# Patient Record
Sex: Female | Born: 1970 | Race: White | Hispanic: Yes | Marital: Married | Smoking: Never smoker
Health system: Southern US, Community
[De-identification: ages and names within clinical notes are randomized; demographics above are authoritative.]

## PROBLEM LIST (undated history)

## (undated) DIAGNOSIS — G43909 Migraine, unspecified, not intractable, without status migrainosus: Secondary | ICD-10-CM

## (undated) DIAGNOSIS — B351 Tinea unguium: Secondary | ICD-10-CM

## (undated) DIAGNOSIS — H547 Unspecified visual loss: Secondary | ICD-10-CM

## (undated) DIAGNOSIS — T7840XA Allergy, unspecified, initial encounter: Secondary | ICD-10-CM

## (undated) HISTORY — DX: Allergy, unspecified, initial encounter: T78.40XA

## (undated) HISTORY — DX: Tinea unguium: B35.1

## (undated) HISTORY — DX: Migraine, unspecified, not intractable, without status migrainosus: G43.909

## (undated) HISTORY — DX: Unspecified visual loss: H54.7

---

## 2004-07-26 ENCOUNTER — Ambulatory Visit: Payer: Self-pay | Admitting: Family Medicine

## 2004-08-02 ENCOUNTER — Ambulatory Visit (HOSPITAL_COMMUNITY): Admission: RE | Admit: 2004-08-02 | Discharge: 2004-08-02 | Payer: Self-pay | Admitting: Family Medicine

## 2004-08-02 ENCOUNTER — Ambulatory Visit: Payer: Self-pay | Admitting: Family Medicine

## 2004-09-03 ENCOUNTER — Ambulatory Visit: Payer: Self-pay | Admitting: Family Medicine

## 2004-09-13 ENCOUNTER — Ambulatory Visit (HOSPITAL_COMMUNITY): Admission: RE | Admit: 2004-09-13 | Discharge: 2004-09-13 | Payer: Self-pay | Admitting: *Deleted

## 2004-10-12 ENCOUNTER — Ambulatory Visit: Payer: Self-pay | Admitting: Sports Medicine

## 2004-10-31 HISTORY — PX: TUBAL LIGATION: SHX77

## 2004-11-11 ENCOUNTER — Ambulatory Visit: Payer: Self-pay | Admitting: Family Medicine

## 2004-11-12 ENCOUNTER — Ambulatory Visit: Payer: Self-pay | Admitting: Sports Medicine

## 2004-12-15 ENCOUNTER — Ambulatory Visit: Payer: Self-pay | Admitting: Family Medicine

## 2004-12-23 ENCOUNTER — Ambulatory Visit: Payer: Self-pay | Admitting: Family Medicine

## 2004-12-25 ENCOUNTER — Inpatient Hospital Stay (HOSPITAL_COMMUNITY): Admission: AD | Admit: 2004-12-25 | Discharge: 2004-12-25 | Payer: Self-pay | Admitting: *Deleted

## 2005-01-06 ENCOUNTER — Ambulatory Visit: Payer: Self-pay | Admitting: Sports Medicine

## 2005-01-10 ENCOUNTER — Inpatient Hospital Stay (HOSPITAL_COMMUNITY): Admission: AD | Admit: 2005-01-10 | Discharge: 2005-01-13 | Payer: Self-pay | Admitting: *Deleted

## 2005-01-16 ENCOUNTER — Ambulatory Visit: Payer: Self-pay | Admitting: Family Medicine

## 2005-01-16 ENCOUNTER — Inpatient Hospital Stay (HOSPITAL_COMMUNITY): Admission: AD | Admit: 2005-01-16 | Discharge: 2005-01-16 | Payer: Self-pay | Admitting: Family Medicine

## 2005-02-21 ENCOUNTER — Ambulatory Visit: Payer: Self-pay | Admitting: *Deleted

## 2005-02-25 ENCOUNTER — Emergency Department (HOSPITAL_COMMUNITY): Admission: EM | Admit: 2005-02-25 | Discharge: 2005-02-25 | Payer: Self-pay | Admitting: Emergency Medicine

## 2005-05-16 ENCOUNTER — Emergency Department (HOSPITAL_COMMUNITY): Admission: EM | Admit: 2005-05-16 | Discharge: 2005-05-16 | Payer: Self-pay | Admitting: Family Medicine

## 2010-12-28 ENCOUNTER — Other Ambulatory Visit: Payer: Self-pay | Admitting: Emergency Medicine

## 2010-12-28 ENCOUNTER — Observation Stay (HOSPITAL_COMMUNITY)
Admission: EM | Admit: 2010-12-28 | Discharge: 2010-12-30 | Disposition: A | Discharge status: Home / Self Care | Payer: Self-pay | Attending: Internal Medicine | Admitting: Internal Medicine

## 2010-12-28 ENCOUNTER — Emergency Department (HOSPITAL_COMMUNITY): Payer: Self-pay

## 2010-12-28 DIAGNOSIS — I10 Essential (primary) hypertension: Secondary | ICD-10-CM | POA: Insufficient documentation

## 2010-12-28 DIAGNOSIS — D509 Iron deficiency anemia, unspecified: Principal | ICD-10-CM | POA: Insufficient documentation

## 2010-12-28 DIAGNOSIS — R51 Headache: Secondary | ICD-10-CM | POA: Insufficient documentation

## 2010-12-28 DIAGNOSIS — Z23 Encounter for immunization: Secondary | ICD-10-CM | POA: Insufficient documentation

## 2010-12-28 DIAGNOSIS — R55 Syncope and collapse: Secondary | ICD-10-CM | POA: Insufficient documentation

## 2010-12-28 DIAGNOSIS — E860 Dehydration: Secondary | ICD-10-CM | POA: Insufficient documentation

## 2010-12-28 DIAGNOSIS — R0602 Shortness of breath: Secondary | ICD-10-CM | POA: Insufficient documentation

## 2010-12-28 LAB — POCT I-STAT, CHEM 8
BUN: 15 mg/dL (ref 6–23)
Calcium, Ion: 1.1 mmol/L — ABNORMAL LOW (ref 1.12–1.32)
Chloride: 108 mEq/L (ref 96–112)
Creatinine, Ser: 0.7 mg/dL (ref 0.4–1.2)
Glucose, Bld: 98 mg/dL (ref 70–99)
HCT: 28 % — ABNORMAL LOW (ref 36.0–46.0)
Hemoglobin: 9.5 g/dL — ABNORMAL LOW (ref 12.0–15.0)
Potassium: 4.4 mEq/L (ref 3.5–5.1)
Sodium: 140 mEq/L (ref 135–145)
TCO2: 21 mmol/L (ref 0–100)

## 2010-12-28 LAB — OCCULT BLOOD, POC DEVICE: Fecal Occult Bld: NEGATIVE

## 2010-12-28 LAB — POCT PREGNANCY, URINE: Preg Test, Ur: NEGATIVE

## 2010-12-28 LAB — GLUCOSE, CAPILLARY: Glucose-Capillary: 135 mg/dL — ABNORMAL HIGH (ref 70–99)

## 2010-12-29 DIAGNOSIS — R55 Syncope and collapse: Secondary | ICD-10-CM

## 2010-12-29 LAB — LIPID PANEL
Cholesterol: 156 mg/dL (ref 0–200)
HDL: 43 mg/dL (ref >39)
LDL Cholesterol: 98 mg/dL (ref 0–99)
Total CHOL/HDL Ratio: 3.6 RATIO
Triglycerides: 75 mg/dL (ref <150)
VLDL: 15 mg/dL (ref 0–40)

## 2010-12-29 LAB — MAGNESIUM: Magnesium: 2.1 mg/dL (ref 1.5–2.5)

## 2010-12-29 LAB — CBC
HCT: 27.4 % — ABNORMAL LOW (ref 36.0–46.0)
Hemoglobin: 8.1 g/dL — ABNORMAL LOW (ref 12.0–15.0)
MCH: 20 pg — ABNORMAL LOW (ref 26.0–34.0)
MCHC: 29.6 g/dL — ABNORMAL LOW (ref 30.0–36.0)
MCV: 67.7 fL — ABNORMAL LOW (ref 78.0–100.0)
Platelets: 430 10*3/uL — ABNORMAL HIGH (ref 150–400)
RBC: 4.05 MIL/uL (ref 3.87–5.11)
RDW: 18.1 % — ABNORMAL HIGH (ref 11.5–15.5)
WBC: 7 10*3/uL (ref 4.0–10.5)

## 2010-12-29 LAB — COMPREHENSIVE METABOLIC PANEL
ALT: 31 U/L (ref 0–35)
AST: 25 U/L (ref 0–37)
Albumin: 3.2 g/dL — ABNORMAL LOW (ref 3.5–5.2)
Alkaline Phosphatase: 55 U/L (ref 39–117)
BUN: 6 mg/dL (ref 6–23)
CO2: 22 mEq/L (ref 19–32)
Calcium: 8.3 mg/dL — ABNORMAL LOW (ref 8.4–10.5)
Chloride: 110 mEq/L (ref 96–112)
Creatinine, Ser: 0.46 mg/dL (ref 0.4–1.2)
GFR calc Af Amer: >60 mL/min (ref >60)
GFR calc non Af Amer: >60 mL/min (ref >60)
Glucose, Bld: 90 mg/dL (ref 70–99)
Potassium: 4 mEq/L (ref 3.5–5.1)
Sodium: 138 mEq/L (ref 135–145)
Total Bilirubin: 0.5 mg/dL (ref 0.3–1.2)
Total Protein: 6 g/dL (ref 6.0–8.3)

## 2010-12-29 LAB — PHOSPHORUS: Phosphorus: 4 mg/dL (ref 2.3–4.6)

## 2010-12-29 LAB — IRON AND TIBC
Iron: 13 ug/dL — ABNORMAL LOW (ref 42–135)
Saturation Ratios: 3 % — ABNORMAL LOW (ref 20–55)
TIBC: 384 ug/dL (ref 250–470)
UIBC: 371 ug/dL

## 2010-12-29 LAB — TSH: TSH: 1.216 u[IU]/mL (ref 0.350–4.500)

## 2010-12-29 LAB — CARDIAC PANEL(CRET KIN+CKTOT+MB+TROPI)
CK, MB: 0.7 ng/mL (ref 0.3–4.0)
CK, MB: 0.6 ng/mL (ref 0.3–4.0)
Relative Index: INVALID (ref 0.0–2.5)
Relative Index: INVALID (ref 0.0–2.5)
Total CK: 75 U/L (ref 7–177)
Total CK: 68 U/L (ref 7–177)
Troponin I: <0.01 ng/mL (ref 0.00–0.06)

## 2010-12-29 LAB — CK TOTAL AND CKMB (NOT AT ARMC)
CK, MB: 0.8 ng/mL (ref 0.3–4.0)
Relative Index: INVALID (ref 0.0–2.5)
Total CK: 85 U/L (ref 7–177)

## 2010-12-29 LAB — FERRITIN: Ferritin: 1 ng/mL — ABNORMAL LOW (ref 10–291)

## 2010-12-29 LAB — TROPONIN I: Troponin I: <0.01 ng/mL (ref 0.00–0.06)

## 2010-12-29 LAB — VITAMIN B12: Vitamin B-12: 241 pg/mL (ref 211–911)

## 2010-12-29 LAB — FOLATE: Folate: 17.6 ng/mL

## 2010-12-29 LAB — BRAIN NATRIURETIC PEPTIDE: Pro B Natriuretic peptide (BNP): <30 pg/mL (ref 0.0–100.0)

## 2010-12-30 LAB — BASIC METABOLIC PANEL
CO2: 23 mEq/L (ref 19–32)
Calcium: 8.6 mg/dL (ref 8.4–10.5)
Creatinine, Ser: 0.54 mg/dL (ref 0.4–1.2)
GFR calc Af Amer: >60 mL/min (ref >60)
GFR calc non Af Amer: >60 mL/min (ref >60)
Sodium: 141 mEq/L (ref 135–145)

## 2010-12-30 LAB — CBC
Hemoglobin: 8.1 g/dL — ABNORMAL LOW (ref 12.0–15.0)
MCH: 20.5 pg — ABNORMAL LOW (ref 26.0–34.0)
MCHC: 30.2 g/dL (ref 30.0–36.0)
Platelets: 382 10*3/uL (ref 150–400)
RBC: 3.95 MIL/uL (ref 3.87–5.11)

## 2011-01-06 NOTE — H&P (Signed)
Linda Bridges, Linda Bridges             ACCOUNT NO.:  [id number]  MEDICAL RECORD NO.:  [id number]           PATIENT TYPE:  I  LOCATION:  4743                         FACILITY:  [facility]  PHYSICIAN:  Lonia Blood, M.D.      DATE OF BIRTH:  December 19, 1970  DATE OF ADMISSION:  12/28/2010 DATE OF DISCHARGE:                             HISTORY & PHYSICAL   PRIMARY CARE PHYSICIAN:  She is unassigned to [place].  PRESENTING COMPLAINT:  Passing out.  HISTORY OF PRESENT ILLNESS:  The patient is a 40 year old [place] female who speaks little or no English that was probably  playing soccer outside with her daughter today when she suddenly developed weakness and almost passed out.  She became very dizzy.  She describes some chest pain.  She was short of breath and felt like she was going to pass out. Since then, she has some headache and blurred vision.  EMS was called. On arrival, her blood pressure was 60/40.  They gave her IV fluid and brought her to the emergency room.  She initially responded to IV fluids.  Her blood pressure started improving, but then again dropped down to 88/63.  The patient is currently responding to IV fluids.  She has no prior history of passing out.  No prior history of heart disease. No other significant past history.  The patient is currently stable. Her only complaint at this point is her headaches.  PAST MEDICAL HISTORY:  She has no past medical history.  ALLERGIES:  No known drug allergies.  MEDICATIONS:  None.  SOCIAL HISTORY:  She is married, with 3 children.  She denied tobacco, alcohol, or IV drug use.  She is for the most part active around the household.  FAMILY HISTORY:  Her mother apparently had heart disease, but she did not know at what age it started.  REVIEW OF SYSTEMS:  The patient's menstrual history shows some erratic bleed.  She had some periods that last 8 days, some that last 4 days. Of recent, she noticed that she has used more pads than usual.  Other than that, all systems reviewed are negative except per HPI.  PHYSICAL EXAMINATION:  VITAL SIGNS:  Temperature 97.1.  Her current blood pressure is 95/63 with a pulse of 78, respiratory rate 17.  Her sats are 99% on room air. GENERAL:  She is a very pleasant [place] woman, non-English-speaking, but communicating through her son and husband.  She has no acute distress. HEENT: PERRL.  EOMI.  No pallor, no jaundice.  No rhinorrhea. NECK:  Supple.  No JVD, no lymphadenopathy. RESPIRATORY:  She has good air entry bilaterally.  No wheezes, no rales, no crackles.  CARDIOVASCULAR SYSTEM:  She has S1, S2.  No audible murmur. ABDOMEN:  Soft, full, nontender with positive bowel sounds. EXTREMITIES:  No edema, cyanosis, or clubbing.  LABORATORY DATA:  Hemoglobin is 9.5 with hematocrit of 28.  Sodium 140, potassium 4.4, chloride 108, glucose 98, BUN 15, creatinine 0.7. Pregnancy test is negative.  Fecal occult blood is also negative.  BNP is less than 30.  Initial cardiac enzymes are negative.  Magnesium 2.1, phosphorus 4.0.  Her EKG showed normal sinus rhythm with nonspecific ST- T wave changes.  Head CT without contrast showed no acute intracranial findings.  Chest x-ray showed no acute cardiopulmonary findings.  ASSESSMENT:  This a 40 year old female presenting with presyncopal episode and profound hypotension.  This happened 5 minutes into exercise.  She had no known prior cardiac history, however, the patient has not been following with patience, so we do not know her cholesterol status.  She had reported family history of heart disease, but she is not sure at what age it started in her mother.  PLAN: 1. Presyncope and hypotension.  We will admit the patient, hydrate her     aggressively, get a 2-D echo.  We do need to rule out any intrinsic     cardiac disease including hypertrophic cardiomyopathy.  One status     ruled out, it may turn out to be just vasovagal syncopal episode      the fact that she has hypotension.  Once we had resuscitated, I     will continue orthostasis daily to make sure she is fully hydrated.     The patient has some anemia, probably from heavy menstrual, but no     evidence of really profound dehydration based on her BUN and     creatinine.  Also, the patient's flu vac was negative.  I therefore     do not believe this is an acute bleeding episodes. 2. Anemia.  Again, her anemia seems to be probably chronic from heavy     menstrual bleed on and off.  I will check anemia panel and     depending on the findings, we will consider putting her on iron.     If her hemoglobin continues to drop, then we will consider possible     transfusion if it drops below 7. 3. Headache.  The cause of her headache is not clear.  It will be     related to her presyncope.  I doubt this is a stroke or any     primarily a neurological case.     Lonia Blood, M.D.     Verlin Grills  D:  12/29/2010  T:  12/29/2010  Job:  [id number]  Electronically Signed by Lonia Blood M.D. on 01/06/2011 06:32:57 AM

## 2011-01-18 ENCOUNTER — Emergency Department (HOSPITAL_COMMUNITY)
Admission: EM | Admit: 2011-01-18 | Discharge: 2011-01-18 | Disposition: A | Discharge status: Home / Self Care | Payer: No Typology Code available for payment source | Attending: Emergency Medicine | Admitting: Emergency Medicine

## 2011-01-18 ENCOUNTER — Emergency Department (HOSPITAL_COMMUNITY): Payer: Self-pay

## 2011-01-18 DIAGNOSIS — S8000XA Contusion of unspecified knee, initial encounter: Secondary | ICD-10-CM | POA: Insufficient documentation

## 2011-01-18 DIAGNOSIS — S20219A Contusion of unspecified front wall of thorax, initial encounter: Secondary | ICD-10-CM | POA: Insufficient documentation

## 2011-01-18 DIAGNOSIS — M25569 Pain in unspecified knee: Secondary | ICD-10-CM | POA: Insufficient documentation

## 2011-01-18 DIAGNOSIS — R071 Chest pain on breathing: Secondary | ICD-10-CM | POA: Insufficient documentation

## 2011-02-08 LAB — URINALYSIS, ROUTINE W REFLEX MICROSCOPIC
Bilirubin Urine: NEGATIVE
Nitrite: NEGATIVE
Specific Gravity, Urine: 1.018 (ref 1.005–1.030)
Urobilinogen, UA: 1 mg/dL (ref 0.0–1.0)
pH: 7 (ref 5.0–8.0)

## 2011-02-08 LAB — CBC
Hemoglobin: 8.1 g/dL — ABNORMAL LOW (ref 12.0–15.0)
MCH: 20.5 pg — ABNORMAL LOW (ref 26.0–34.0)
Platelets: 410 10*3/uL — ABNORMAL HIGH (ref 150–400)
RBC: 3.96 MIL/uL (ref 3.87–5.11)
WBC: 11.9 10*3/uL — ABNORMAL HIGH (ref 4.0–10.5)

## 2011-02-08 LAB — DIFFERENTIAL
Basophils Absolute: 0 10*3/uL (ref 0.0–0.1)
Eosinophils Relative: 0 % (ref 0–5)
Lymphocytes Relative: 14 % (ref 12–46)
Lymphs Abs: 1.7 10*3/uL (ref 0.7–4.0)
Monocytes Relative: 5 % (ref 3–12)
Neutrophils Relative %: 81 % — ABNORMAL HIGH (ref 43–77)

## 2011-02-08 LAB — URINE MICROSCOPIC-ADD ON

## 2011-02-08 LAB — D-DIMER, QUANTITATIVE: D-Dimer, Quant: 0.35 ug/mL-FEU (ref 0.00–0.48)

## 2012-08-13 IMAGING — CT CT HEAD W/O CM
1 of 2 series · 13 of 30 positions shown, 17 images · non-contrast
Comparison: None

CLINICAL DATA: Shortness of breath, headache and dizziness.

CT HEAD WITHOUT CONTRAST
TECHNIQUE: Contiguous axial images were obtained from the base of
the skull through the vertex without contrast.

[Series 2: brain · axial · 0.47mm/px · z∈[+135,+259]mm · 13 of 28 slices shown, 17 images]
[im 2/28  brain]
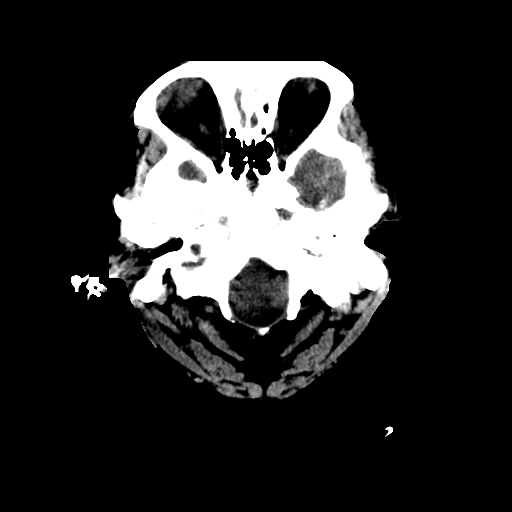
[im 2/28  bone]
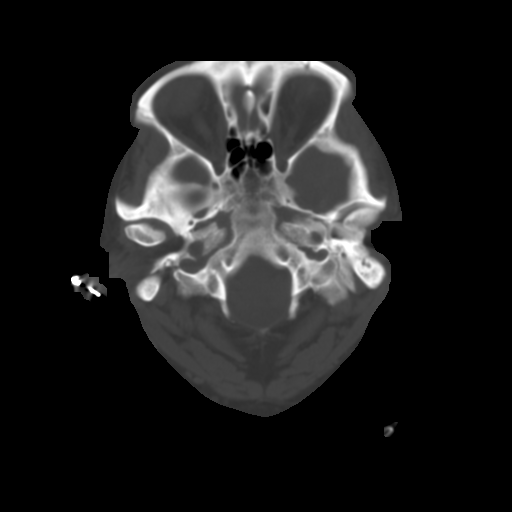
[im 4/28  brain]
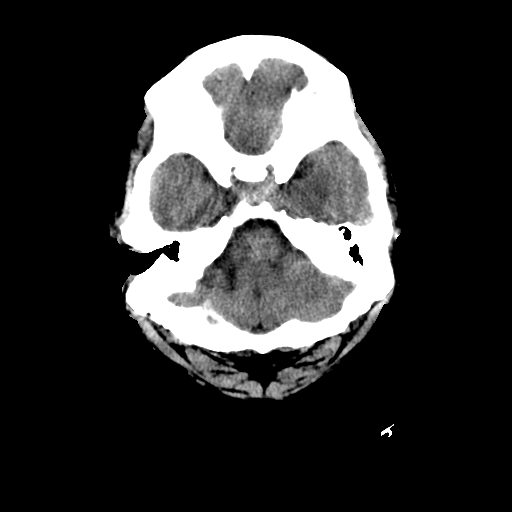
[im 6/28  brain]
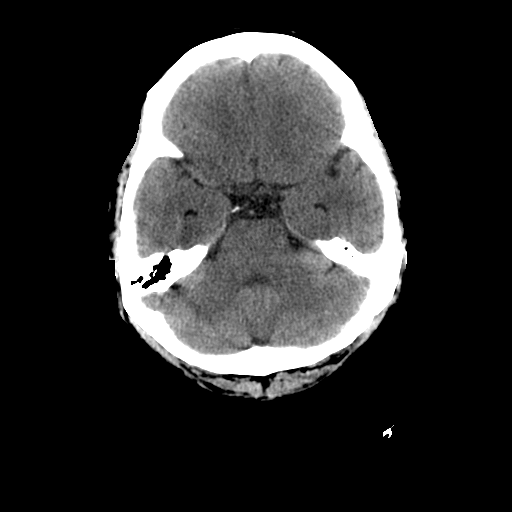
[im 8/28  brain]
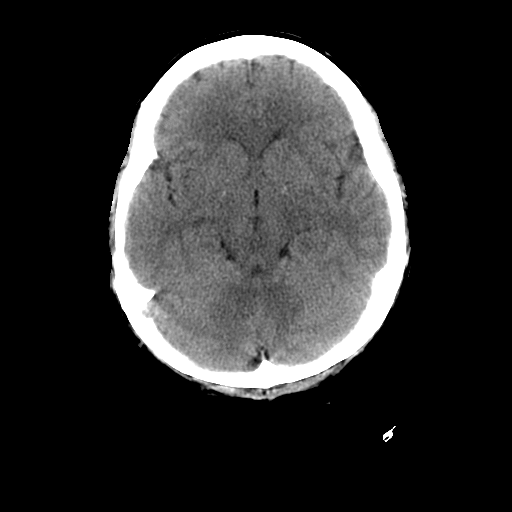
[im 10/28  brain]
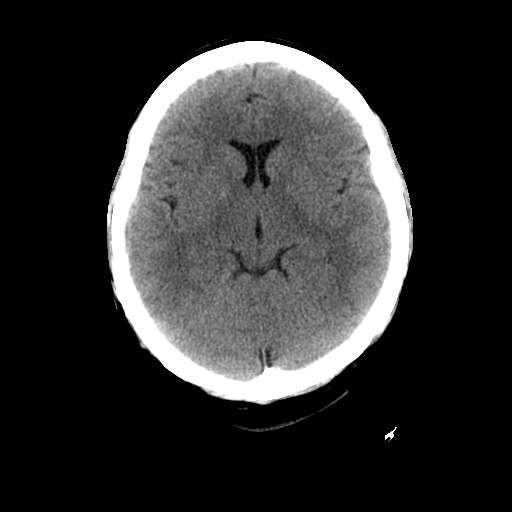
[im 10/28  bone]
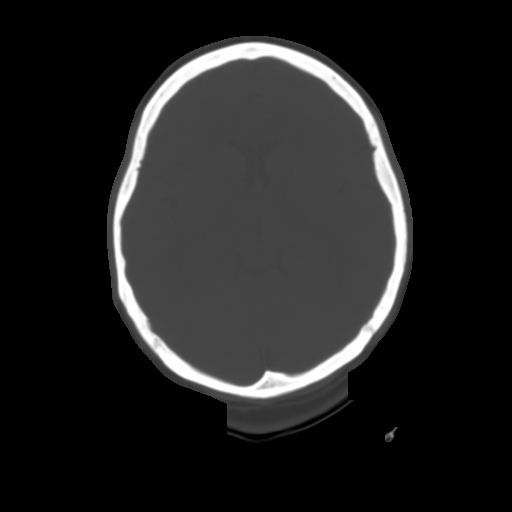
[im 12/28  brain]
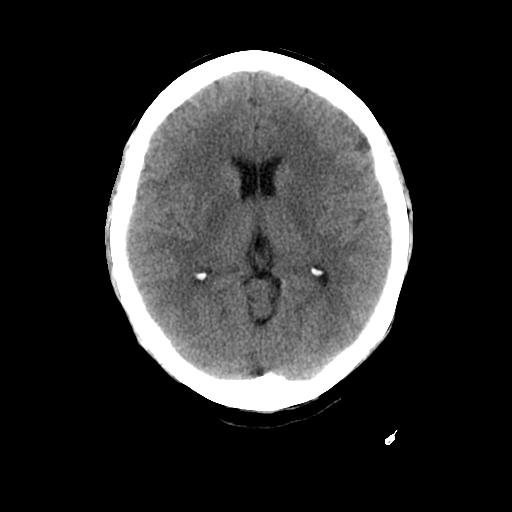
[im 14/28  brain]
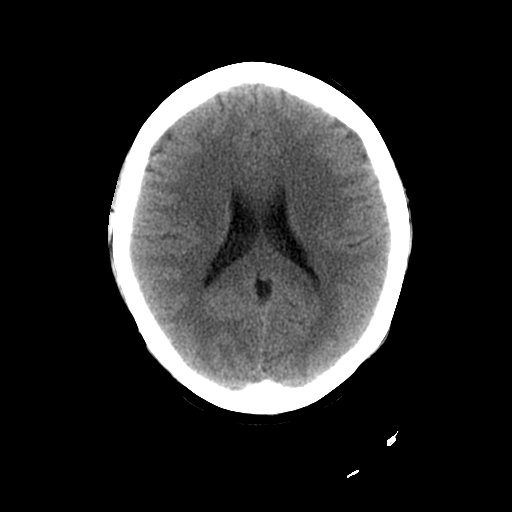
[im 16/28  brain]
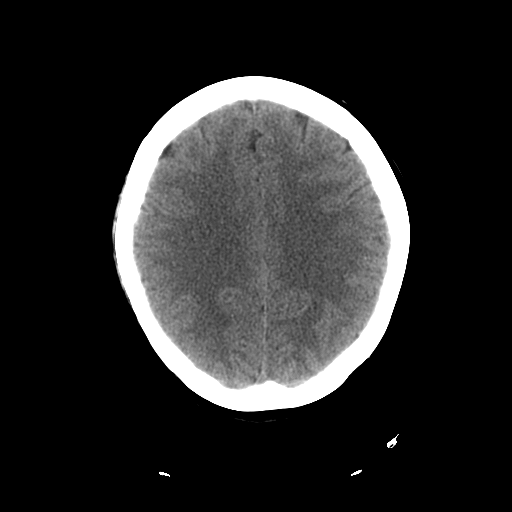
[im 18/28  brain]
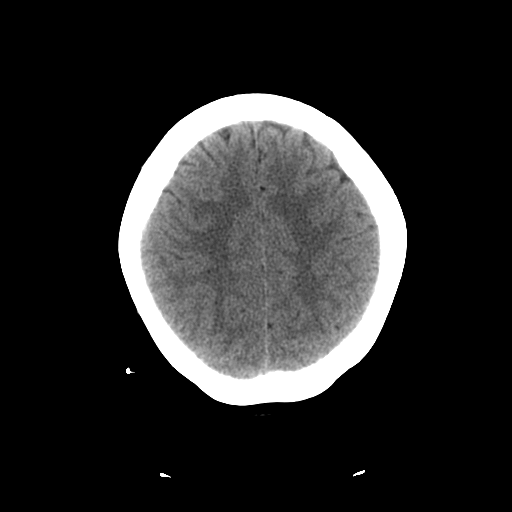
[im 18/28  bone]
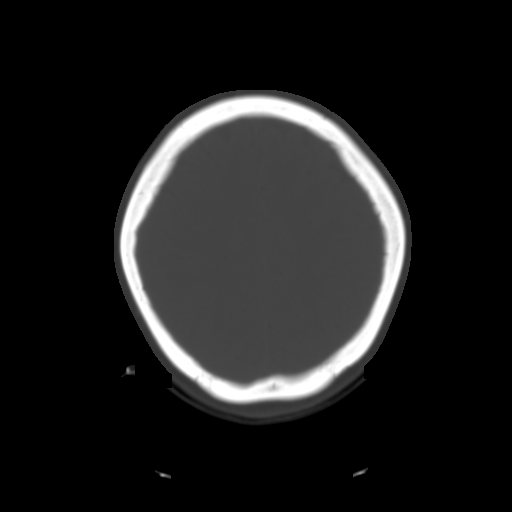
[im 20/28  brain]
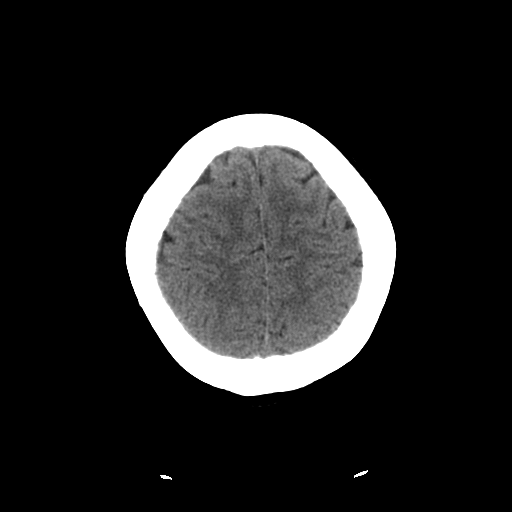
[im 22/28  brain]
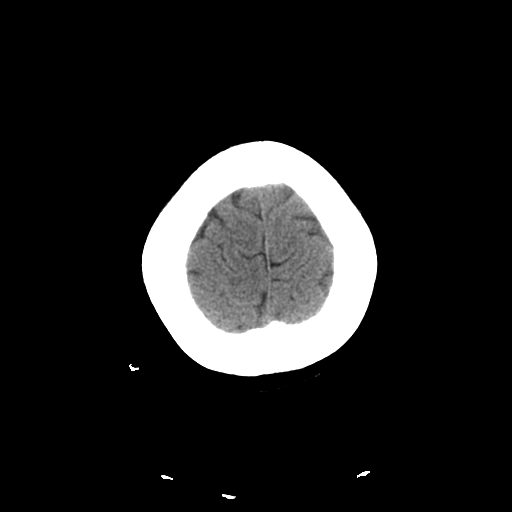
[im 24/28  brain]
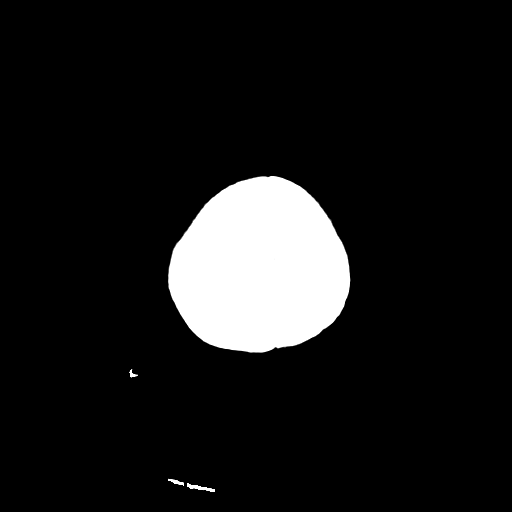
[im 26/28  brain]
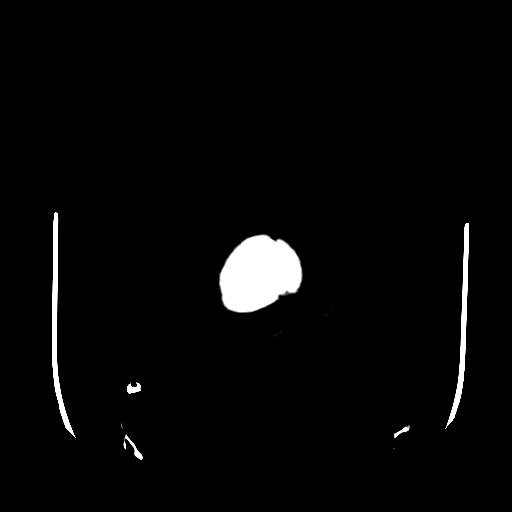
[im 26/28  bone]
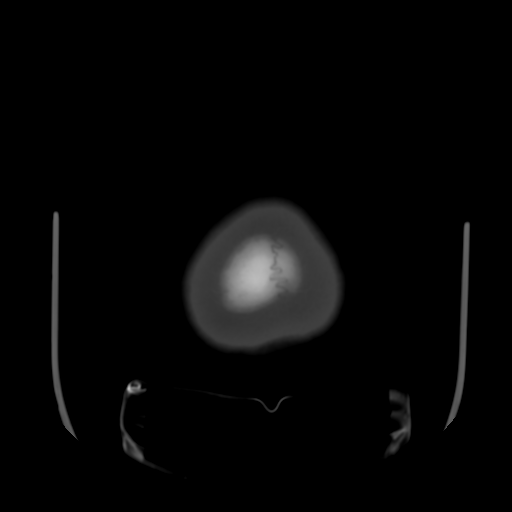

[13 of 30 positions shown; findings below may reference images not displayed]

FINDINGS: The ventricles are normal.  No extra-axial fluid
collections are seen.  The brainstem and cerebellum are
unremarkable.  No acute intracranial findings such as infarction or
hemorrhage.  No mass lesions.

The bony calvarium is intact.  The visualized paranasal sinuses and
mastoid air cells are clear.
IMPRESSION: No acute intracranial findings or mass lesions.

## 2015-07-02 DIAGNOSIS — B351 Tinea unguium: Secondary | ICD-10-CM

## 2015-07-02 HISTORY — DX: Tinea unguium: B35.1

## 2015-08-13 ENCOUNTER — Ambulatory Visit (INDEPENDENT_AMBULATORY_CARE_PROVIDER_SITE_OTHER): Payer: Self-pay | Admitting: Internal Medicine

## 2015-08-13 ENCOUNTER — Encounter: Payer: Self-pay | Admitting: Internal Medicine

## 2015-08-13 VITALS — BP 114/80 | HR 72 | Ht <= 58 in | Wt 151.0 lb

## 2015-08-13 DIAGNOSIS — G43909 Migraine, unspecified, not intractable, without status migrainosus: Secondary | ICD-10-CM | POA: Insufficient documentation

## 2015-08-13 DIAGNOSIS — Z79899 Other long term (current) drug therapy: Secondary | ICD-10-CM

## 2015-08-13 DIAGNOSIS — L819 Disorder of pigmentation, unspecified: Secondary | ICD-10-CM

## 2015-08-13 DIAGNOSIS — T7840XA Allergy, unspecified, initial encounter: Secondary | ICD-10-CM | POA: Insufficient documentation

## 2015-08-13 DIAGNOSIS — B351 Tinea unguium: Secondary | ICD-10-CM

## 2015-08-13 NOTE — Patient Instructions (Addendum)
Escisin de lesiones en la piel, cuidados posteriores (Excision of Skin Lesions, Care After) Siga estas instrucciones durante las prximas semanas. Estas indicaciones le proporcionan informacin acerca de cmo deber cuidarse despus del procedimiento. El mdico tambin podr darle instrucciones ms especficas. El tratamiento ha sido planificado segn las prcticas mdicas actuales, pero en algunos casos pueden ocurrir problemas. Comunquese con el mdico si tiene algn problema o dudas despus del procedimiento. QU ESPERAR DESPUS DEL PROCEDIMIENTO Despus del procedimiento, es comn tener dolor o Social workermolestias en el lugar de la escisin. INSTRUCCIONES PARA EL CUIDADO EN EL HOGAR  Tome los medicamentos de venta libre y los recetados solamente como se lo haya indicado el mdico.  Siga las indicaciones del mdico acerca de lo siguiente:  Cmo cuidar Immunologistel lugar de la escisin. Debe mantener Immunologistel lugar limpio, seco y protegido durante al menos 48horas.  Cmo y cundo cambiar las vendas (vendaje).  Cundo retirar el vendaje.  Quitar lo que sea que se haya usado para Psychologist, educationalcerrar el lugar de la escisin.  Controle todos los das la zona de la escisin para detectar signos de infeccin. Est atento a lo siguiente:  Dolor, hinchazn o enrojecimiento.  Lquido, sangre o pus.  En caso de hemorragia, presione la herida durante 20minutos de forma suave pero firme con una toalla doblada.  Evite las actividades y los ejercicios de alto impacto hasta que le quiten los puntos (suturas) o hasta que la zona cicatrice.  Siga las indicaciones del mdico acerca de cmo minimizar la formacin de cicatrices. Evite la exposicin al sol hasta que el rea se haya cicatrizado. Las cicatrices deben reducirse con el transcurso del [place] Mill[place]po.  Concurra a todas las visitas de control como se lo haya indicado el mdico. Esto es importante. SOLICITE ATENCIN MDICA SI:  Lance Mussiene fiebre.  Tiene enrojecimiento, hinchazn o Product/process development scientistdolor  en el lugar de la escisin.  Tiene lquido, sangre o pus que emanan del lugar de la escisin.  Tiene Brewing technologistsangrado permanente en el lugar de la escisin.  Tiene dolor, y este no mejora despus de 2 o 3das del procedimiento.  Observa irregularidades en la piel o cambios en la sensibilidad.   Esta informacin no tiene Theme park managercomo fin reemplazar el consejo del mdico. Asegrese de hacerle al mdico cualquier pregunta que tenga.   Document Released: 07/08/2015 Elsevier Interactive Patient Education [employer]2016 Elsevier Inc.

## 2015-08-13 NOTE — Progress Notes (Signed)
Patient ID: Linda DolphinHortencia Cabrera Bridges, female   DOB: [birth year], 44 y.o.   MRN: [id number]  Here for removal of pigmented lesion from left shoulder/suprclavicular area.  Pt. Requested removal at last visit.  Did take Ibuprofen 800 mg 2 nights ago.  Does not bleed easily.   Discussed risk of bleeding or infection with removal.   Risks interpreted for patient and she wishes to proceed.  Procedure Note:  Left supraclavicular area cleaned and draped in sterile fashion.  5 cc of 1 % Lidocaine with epi injected to subcutaneous tissue surrounding 5X7 mm nevus.  Elliptical total excisional biopsy of lesion performed with grossly free margins.  Good hemostasis obtained.  Elliptical wound closed with 3 simple interrupted 4-0 nylon sutures.   Pt. Tolerated procedure without complication.   Dressing applied.  A/P  1.  Pigmented nevus of uncertain nature.  Excisional biopsy completed.  [facility]. Pt given oral and written instructions with Spanish interpretation on wound care and triggers to call back before returning in 10 days for suture removal.    2.  Onychomycosis of toenails:  Pt. At about 6 weeks of Terbinafine.  Check hepatic function panel.

## 2015-08-14 LAB — HEPATIC FUNCTION PANEL
ALK PHOS: 78 IU/L (ref 39–117)
ALT: 12 IU/L (ref 0–32)
AST: 12 IU/L (ref 0–40)
Albumin: 4.2 g/dL (ref 3.5–5.5)
BILIRUBIN, DIRECT: 0.06 mg/dL (ref 0.00–0.40)
Bilirubin Total: 0.2 mg/dL (ref 0.0–1.2)
TOTAL PROTEIN: 7.5 g/dL (ref 6.0–8.5)

## 2015-08-18 ENCOUNTER — Encounter: Payer: Self-pay | Admitting: Internal Medicine

## 2015-08-18 ENCOUNTER — Ambulatory Visit (INDEPENDENT_AMBULATORY_CARE_PROVIDER_SITE_OTHER): Payer: Self-pay | Admitting: Internal Medicine

## 2015-08-18 VITALS — Temp 98.4°F

## 2015-08-18 DIAGNOSIS — T814XXA Infection following a procedure, initial encounter: Secondary | ICD-10-CM

## 2015-08-18 DIAGNOSIS — IMO0001 Reserved for inherently not codable concepts without codable children: Secondary | ICD-10-CM

## 2015-08-18 MED ORDER — CEPHALEXIN 500 MG PO CAPS: 500.0000 mg | ORAL_CAPSULE | Freq: Four times a day (QID) | ORAL | Status: DC

## 2015-08-18 NOTE — Progress Notes (Signed)
   Subjective:    Patient ID: Linda Bridges, female    DOB: 1971/01/13, 44 y.o.   MRN: [id number]  HPI  Patient here status post removal of skin lesion from left upper back/shoulder Developed some redness, pain and discharge from wound, left shoulder yesterday.  No fever.    Review of Systems     Objective:   Physical Exam  Mild inflammation of wound edges.  With pressure on the anterior aspect of wound, scant yellow pustular discharge comes through front 2 sutures.  Tender with pressure. Anterior suture removed with 2 mm opening of wound in that area resulting. Bandage reapplied      Assessment & Plan:  Wound infection:  Suture removed for better drainage.  Hopefully, can leave the other 2 sutures and start antibiotics. Cephalexin 500 mg 4 times daily for 7 days.

## 2015-08-18 NOTE — Patient Instructions (Signed)
Change dressing twice daily Keep wound dry.

## 2015-08-20 ENCOUNTER — Ambulatory Visit (INDEPENDENT_AMBULATORY_CARE_PROVIDER_SITE_OTHER): Payer: Self-pay | Admitting: Internal Medicine

## 2015-08-20 ENCOUNTER — Ambulatory Visit: Payer: Self-pay | Admitting: Internal Medicine

## 2015-08-20 ENCOUNTER — Encounter: Payer: Self-pay | Admitting: Internal Medicine

## 2015-08-20 VITALS — BP 122/80 | HR 72 | Ht <= 58 in | Wt 151.0 lb

## 2015-08-20 DIAGNOSIS — T814XXD Infection following a procedure, subsequent encounter: Secondary | ICD-10-CM

## 2015-08-20 DIAGNOSIS — IMO0001 Reserved for inherently not codable concepts without codable children: Secondary | ICD-10-CM

## 2015-08-20 NOTE — Progress Notes (Signed)
   Subjective:    Patient ID: Linda Bridges, female    DOB: 08/05/1971, 44 y.o.   MRN: [id number]  HPI  Wound infection.  Patient did not get Cephalexin filled until yesterday afternoon.  Has taken a total of 5 tablets thus far. Despite this, feels much better.  Mainly, the pain is better, but discharge is decreased as well.    Review of Systems     Objective:   Physical Exam  Erythema and swelling about wound improved.  Minimal pustular discharge from end of wound where suture removed. Minimally tender today.      Assessment & Plan:  Wound infection:  Healing.  Do not believe will need to remove remaining sutures early.

## 2015-08-24 ENCOUNTER — Ambulatory Visit (INDEPENDENT_AMBULATORY_CARE_PROVIDER_SITE_OTHER): Payer: Self-pay | Admitting: Internal Medicine

## 2015-08-24 ENCOUNTER — Encounter: Payer: Self-pay | Admitting: Internal Medicine

## 2015-08-24 ENCOUNTER — Ambulatory Visit: Payer: No Typology Code available for payment source | Admitting: Internal Medicine

## 2015-08-24 VITALS — BP 110/80 | HR 82 | Ht <= 58 in | Wt 150.0 lb

## 2015-08-24 DIAGNOSIS — Z4802 Encounter for removal of sutures: Secondary | ICD-10-CM

## 2015-08-27 ENCOUNTER — Telehealth: Payer: Self-pay | Admitting: Internal Medicine

## 2015-08-27 NOTE — Telephone Encounter (Signed)
08/27/15 11:14am patient called this morning asking for advice on what she needs to do with wound on her left shoulder. She is having some drainage looks like water mix with blood from a little "hole" that is still there. No pain, swelling, or redness on the area.

## 2015-08-27 NOTE — Progress Notes (Signed)
Quick Note:  08/27/15 11:06 called patient. Voicemail is not set up to leave message, will try again later. ______

## 2015-08-27 NOTE — Progress Notes (Signed)
Quick Note:  10/247/16 11:14 am patient returned call. Patient informed of results. ______

## 2015-08-28 NOTE — Telephone Encounter (Signed)
The wound should heal from the bottom up and the hole eventually close, the discharge should gradually decrease.   If the discharge is increasing, there is increased redness, pain, or swelling, she needs to come back and be seen.

## 2015-08-31 NOTE — Telephone Encounter (Signed)
08/31/15 11:30 am Patient informed. Patient states wound looks better now. She is washing and drying the area 3 times a day and not covering it with the band aid so it can vent. Patient will call if something is abnormal.

## 2015-11-03 NOTE — Progress Notes (Signed)
Pt. Here for remaining 2 sutures to be removed from left shoulder wound that developed secondary infection after excisional benign skin lesion removal on 10/132016.  Pt. Has about 1 more day of antibiotics--Cephalexin  Wound on left shoulder without redness, swelling or tenderness today.  Pinhole opening at one end where suture removed previously to allow pustular discharge to drain.  2/3 of the 1 cm wound has edges well opposed and appears to be healing nicely.  Sutures removed without dehiscence.  Pt. Tolerated well.  A/P:  Suture removal from small surgical wound with secondary infection, now healing well.

## 2015-11-03 NOTE — Patient Instructions (Signed)
Finish antibiotics Twice daily dressing changes

## 2015-11-20 ENCOUNTER — Ambulatory Visit (INDEPENDENT_AMBULATORY_CARE_PROVIDER_SITE_OTHER): Payer: Self-pay | Admitting: Internal Medicine

## 2015-11-20 VITALS — BP 118/80 | HR 76 | Ht <= 58 in | Wt 153.0 lb

## 2015-11-20 DIAGNOSIS — H547 Unspecified visual loss: Secondary | ICD-10-CM

## 2015-11-20 DIAGNOSIS — B351 Tinea unguium: Secondary | ICD-10-CM

## 2015-11-20 DIAGNOSIS — K0889 Other specified disorders of teeth and supporting structures: Secondary | ICD-10-CM

## 2015-11-20 DIAGNOSIS — L600 Ingrowing nail: Secondary | ICD-10-CM

## 2015-11-20 MED ORDER — CEPHALEXIN 500 MG PO CAPS: 500.0000 mg | ORAL_CAPSULE | Freq: Two times a day (BID) | ORAL | Status: DC

## 2015-11-20 NOTE — Progress Notes (Signed)
   Subjective:    Patient ID: Linda Bridges, female    DOB: 1970-11-02, 45 y.o.   MRN: [id number]  HPI  1.  Requests eye referral:  Describes holding reading material away from her face to see.  Tried reading glasses, but can only see close up with them (discussed that's the way they work--should look over the top for distance vision)  2.  Requests dental referral:  Piece of tooth fell off from anterior premolar on upper right jaw.  Has large filling in that tooth.  3.  Nail problem:  Ingrown right great toenail. Finished 90 days of Terbinafine in early December per patient.  Her nails were much more significantly involved.  About 5 days ago, developed swelling, redness and pustular discharge of medial and medial base about toenail margin.   Not treating shoes and not clear if cleaning shower floor.   Current outpatient prescriptions:  .  verapamil (CALAN) 80 MG tablet, Take 80 mg by mouth 2 (two) times daily., Disp: , Rfl:  .  clonazePAM (KLONOPIN) 1 MG tablet, 1/2 to 1 tab by mouth twice daily as needed for neck and headache pain, Disp: 20 tablet, Rfl: 0 .  fexofenadine (ALLEGRA) 180 MG tablet, Take 180 mg by mouth daily. Reported on 01/15/2016, Disp: , Rfl:  .  ibuprofen (ADVIL,MOTRIN) 200 MG tablet, Take 800 mg by mouth every 6 (six) hours as needed. Reported on 11/20/2015, Disp: , Rfl:    No Known Allergies  Review of Systems     Objective:   Physical Exam  NAD HEENT:  PERRL, EOMI, discs sharp, TMs pearly gray, throat without injection. Right upper premolar with anterior portion broken off, cavity Neck: Supple, no adenopathy Chest:  CTA CV:  RRR Right great toe with mild periungual inflammation and scant milky discharge.  Not clear nail involved with discoloration at this time        Assessment & Plan:  1.  Probable presbyopia:  Optometry referral.  Also recommend trying on different strengths of reading glasses in meantime--see if any help.  2.  Dental decay and  pain: Dental referral.  3.  Ingrown right great toenail vs. Fungal infection.  Try Terbinafine cream twice daily to the area for 2 weeks after soaking in warm water..  To call if does not resolve

## 2015-11-20 NOTE — Patient Instructions (Signed)
Terbinafine Cream --apply twice daily to toenails and surrounding skin after soaking feet in warm water for 20 minutes--dry and apply cream

## 2015-12-23 ENCOUNTER — Ambulatory Visit (INDEPENDENT_AMBULATORY_CARE_PROVIDER_SITE_OTHER): Payer: Self-pay | Admitting: Licensed Clinical Social Worker

## 2015-12-23 DIAGNOSIS — F32A Depression, unspecified: Secondary | ICD-10-CM

## 2015-12-23 DIAGNOSIS — F329 Major depressive disorder, single episode, unspecified: Secondary | ICD-10-CM
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
## 2016-01-01 ENCOUNTER — Encounter: Payer: Self-pay | Admitting: Internal Medicine

## 2016-01-01 ENCOUNTER — Ambulatory Visit (INDEPENDENT_AMBULATORY_CARE_PROVIDER_SITE_OTHER): Payer: Self-pay | Admitting: Internal Medicine

## 2016-01-01 VITALS — BP 116/76 | HR 80 | Temp 98.4°F | Resp 18 | Ht <= 58 in | Wt 154.0 lb

## 2016-01-01 DIAGNOSIS — B349 Viral infection, unspecified: Secondary | ICD-10-CM

## 2016-01-01 NOTE — Patient Instructions (Signed)
Keep drinking water. Take Nyquil and Dayquil as needed Call if you start feeling worse again.

## 2016-01-01 NOTE — Progress Notes (Signed)
   Subjective:    Patient ID: Linda Bridges, female    DOB: Sep 29, 1971, 45 y.o.   MRN: [id number]  HPI  Started getting ill 4 days ago.  Started with dry cough, now congested, fever up to 101 orally. No fever in past 24 hours. Burns when pulls air in through nose.  Mouth dry.  When coughs, feels short of breath.  No nausea, diarrhea. + Body aches as well.    Decreased appetite, but drinking well.   Tea with lemon and honey Halls cough drops.   Dayquil and Nyquil--help a bit. She is feeling better today.       Other family members ill with similar symptoms in days previous.    Review of Systems     Objective:   Physical Exam Mildly ill appearing HEENT:  PERRL, EOMI, TMs pearly gray, throat with mild injection, no exudate, Nasal mucosa swollen and red with clear discharge.  NT over frontal and maxillary sinuses Neck: Supple, no adenopathy Chest:  CTA CV:  RRR without murmur or rub, radial pulses normal and equal Abd: S, NT, No HSM or masses, +BS Skin:  No Rash        Assessment & Plan:  LIkely influenza or other viral syndrome.  Too late for helpful initiation of Tamiflu and patient actually improving. Supportive care with Dayquil and Nyquil, Ibuprofen and fluids. Encouraged influenza vaccine next fall.

## 2016-01-15 ENCOUNTER — Encounter: Payer: Self-pay | Admitting: Internal Medicine

## 2016-01-15 ENCOUNTER — Ambulatory Visit (INDEPENDENT_AMBULATORY_CARE_PROVIDER_SITE_OTHER): Payer: Self-pay | Admitting: Internal Medicine

## 2016-01-15 VITALS — BP 120/78 | HR 82 | Ht <= 58 in | Wt 152.0 lb

## 2016-01-15 DIAGNOSIS — G44209 Tension-type headache, unspecified, not intractable: Secondary | ICD-10-CM

## 2016-01-15 DIAGNOSIS — F4321 Adjustment disorder with depressed mood: Secondary | ICD-10-CM

## 2016-01-15 MED ORDER — CLONAZEPAM 1 MG PO TABS: ORAL_TABLET | ORAL | Status: DC

## 2016-01-15 NOTE — Progress Notes (Signed)
   Subjective:    Patient ID: Linda Bridges, female    DOB: 11-07-1970, 45 y.o.   MRN: [id number]  HPI   Has had posterior headache and bilateral posterior neck and shoulder pain for 4-5 days.  Her father died about 6 days ago from complications of DM and Parkinsons and this seems to have been a big part in her not feeling well.   She would appreciate speaking with Nilda SimmerNatosha Knight to help with grieving process. No history of injury to the area Is sleeping ok.   Ibuprofen 800 to 1000 mg and is waiting at least 6 hours in between.  Relieves pain, but recurs in 3-4 hours.     Is taking Verapamil twice daily and feels that has really decreased the frequency and severity of her migraines.  Is only having a migraine the day before her period starts now.  Is very mild in comparison and is able to get her daily work done on those days.  Meds: 1.  Verapamil 80 mg 1/2 tab twice daily 2.  Fexofenadine 180 mg once daily as needed for allergies  No Known Allergies    Review of Systems     Objective:   Physical Exam  Sad and tearful when discussing loss of father. HEENT:  PERRL, EOMI, discs sharp, TMs pearly gray, throat without injection, Neck;  Supple, no adenopathy. Tender posteriorly along both traps to shoulder and medial to scapulae--palpation of traps reproduces pakn Chest:  CTA CV:  RRR without murmur or rub, radial pulses normal and equal Neuro:  A & O x 3, CN II-XII grossly intact, DTRs 2+/4, Motor 5/5 throughout      Assessment & Plan:  1.  Muscle Tension Headache and neck pain:  Clonazepam 1 mg 1/2 to 1 tab once to twice daily as needed.  #20 Ibuprofen 800 mg twice daily with food as needed Massage. 2.  Grief:  Nilda SimmerNatosha Knight, LCSW here to see patient.

## 2016-01-20 ENCOUNTER — Other Ambulatory Visit: Payer: No Typology Code available for payment source | Admitting: Licensed Clinical Social Worker

## 2016-01-26 ENCOUNTER — Encounter: Payer: Self-pay | Admitting: Internal Medicine

## 2016-01-27 ENCOUNTER — Other Ambulatory Visit: Payer: No Typology Code available for payment source | Admitting: Licensed Clinical Social Worker

## 2017-02-21 ENCOUNTER — Other Ambulatory Visit (INDEPENDENT_AMBULATORY_CARE_PROVIDER_SITE_OTHER): Payer: Self-pay

## 2017-02-21 DIAGNOSIS — R350 Frequency of micturition: Secondary | ICD-10-CM

## 2017-02-21 LAB — POCT URINALYSIS DIPSTICK
BILIRUBIN UA: NEGATIVE
Blood, UA: NEGATIVE
Glucose, UA: NEGATIVE
KETONES UA: NEGATIVE
Nitrite, UA: NEGATIVE
PROTEIN UA: 15
SPEC GRAV UA: 1.01 (ref 1.010–1.025)
Urobilinogen, UA: 0.2 E.U./dL
pH, UA: 7 (ref 5.0–8.0)

## 2017-02-21 MED ORDER — CIPROFLOXACIN HCL 500 MG PO TABS: 500.0000 mg | ORAL_TABLET | Freq: Two times a day (BID) | ORAL | 0 refills | Status: AC

## 2017-02-21 NOTE — Progress Notes (Signed)
Having pain with urination. Small leuks on UA:  Cipro 500 mg twice daily for 3 days. To call if no better in 1-2 days. Sending urine for culture History of tubal ligation

## 2017-02-23 LAB — URINE CULTURE

## 2021-08-11 ENCOUNTER — Other Ambulatory Visit: Payer: Self-pay

## 2021-08-11 DIAGNOSIS — Z1231 Encounter for screening mammogram for malignant neoplasm of breast: Secondary | ICD-10-CM

## 2021-08-17 ENCOUNTER — Other Ambulatory Visit: Payer: Self-pay | Admitting: Obstetrics and Gynecology

## 2021-08-17 DIAGNOSIS — Z1231 Encounter for screening mammogram for malignant neoplasm of breast: Secondary | ICD-10-CM

## 2021-10-07 ENCOUNTER — Ambulatory Visit
Admission: RE | Admit: 2021-10-07 | Discharge: 2021-10-07 | Disposition: A | Discharge status: Home / Self Care | Payer: No Typology Code available for payment source | Source: Ambulatory Visit | Attending: Internal Medicine | Admitting: Internal Medicine

## 2021-10-07 ENCOUNTER — Other Ambulatory Visit: Payer: Self-pay

## 2021-10-07 ENCOUNTER — Ambulatory Visit: Payer: Self-pay | Admitting: *Deleted

## 2021-10-07 VITALS — BP 124/86 | Wt 162.5 lb

## 2021-10-07 DIAGNOSIS — Z1211 Encounter for screening for malignant neoplasm of colon: Secondary | ICD-10-CM

## 2021-10-07 DIAGNOSIS — Z1231 Encounter for screening mammogram for malignant neoplasm of breast: Secondary | ICD-10-CM

## 2021-10-07 DIAGNOSIS — Z01419 Encounter for gynecological examination (general) (routine) without abnormal findings: Secondary | ICD-10-CM

## 2021-10-07 DIAGNOSIS — N898 Other specified noninflammatory disorders of vagina: Secondary | ICD-10-CM

## 2021-10-07 NOTE — Progress Notes (Signed)
[name] is a 50 y.o. G3P3000 female who presents to [facility] clinic today with no complaints.    Pap Smear: Pap smear completed today. Last Pap smear was around 5 years ago at [facility] clinic and patient was unsure of her result. Patient stated that she was told that she needed a repeat Pap smear and did not complete the recommended follow-up. Per patient has no history of an abnormal Pap smear. Last Pap smear result is not available in Epic.   Physical exam: Breasts Breasts symmetrical. No skin abnormalities bilateral breasts. No nipple retraction right breast. Left nipple inverted and per patient is normal for her. No nipple discharge bilateral breasts. No lymphadenopathy. No lumps palpated bilateral breasts.      Pelvic/Bimanual Ext Genitalia No lesions, no swelling and no discharge observed on external genitalia.        Vagina Vagina pink and normal texture. No lesions and thick creamy white discharge observed in vagina. Wet prep completed.       Cervix Cervix is present. Cervix pink and of normal texture. Thick white creamy discharge observed on cervix.   Uterus Uterus is present and palpable. Uterus in normal position and normal size.        Adnexae Bilateral ovaries present and palpable. No tenderness on palpation.         Rectovaginal No rectal exam completed today since patient had no rectal complaints. No skin abnormalities observed on exam.     Smoking History: Patient has never smoked.    Patient Navigation: Patient education provided. Access to services provided for patient through [facility] program. Spanish interpreter [name] from [facility] provided.   Colorectal Cancer Screening: Per patient has never had colonoscopy completed. FIT Test given to patient to complete. No complaints today.    Breast and Cervical Cancer Risk Assessment: Patient does not have family history of breast cancer, known genetic mutations, or radiation treatment  to the chest before age 49. Patient does not have history of cervical dysplasia, immunocompromised, or DES exposure in-utero.  Risk Assessment     Risk Scores       10/07/2021   Last edited by: Narda Rutherford, LPN   5-year risk: 0.7 %   Lifetime risk: 6.1 %            A: B[facility]CP exam with pap smear No complaints.  P: Referred patient to the [facility] of [facility] for a screening mammogram on mobile unit. Appointment scheduled Thursday, October 07, 2021 at 1130.  Priscille Heidelberg, RN 10/07/2021 11:19 AM   Priscille Heidelberg, RN 10/07/2021 10:37 AM

## 2021-10-07 NOTE — Patient Instructions (Signed)
Explained breast self awareness with Linda Bridges. Pap smear completed today. Let her know BCCCP will cover Pap smears and HPV typing every 5 years unless has a history of abnormal Pap smears. Referred patient to the [facility] of [facility] for a screening mammogram on mobile unit. Appointment scheduled Thursday, October 07, 2021 at 1130. Patient escorted to the mobile unit following BCCCP appointment for her screening mammogram. Let patient know will follow up with her within the next week with results of her wet prep and Pap smear by phone. Informed patient that the Breast Center will follow up with her within the next couple of weeks with results of her mammogram by letter or phone. Linda Bridges verbalized understanding.  Nneoma Harral, Kathaleen Maser, RN 10:37 AM

## 2021-10-08 LAB — CERVICOVAGINAL ANCILLARY ONLY
Bacterial Vaginitis (gardnerella): NEGATIVE
Candida Glabrata: NEGATIVE
Candida Vaginitis: NEGATIVE
Comment: NEGATIVE
Comment: NEGATIVE
Comment: NEGATIVE
Comment: NEGATIVE
Trichomonas: NEGATIVE

## 2021-10-11 ENCOUNTER — Telehealth: Payer: Self-pay

## 2021-10-11 LAB — CYTOLOGY - PAP
Comment: NEGATIVE
Diagnosis: NEGATIVE
High risk HPV: NEGATIVE

## 2021-10-11 NOTE — Telephone Encounter (Signed)
Patient informed negative Pap/HPV, wet prep results, next pap smear due in 5 years. Patient verbalized understanding.

## 2021-10-14 ENCOUNTER — Ambulatory Visit: Payer: Self-pay

## 2021-10-22 LAB — FECAL OCCULT BLOOD, IMMUNOCHEMICAL: Fecal Occult Bld: NEGATIVE

## 2021-10-22 LAB — SPECIMEN STATUS REPORT

## 2021-10-27 ENCOUNTER — Telehealth: Payer: Self-pay

## 2021-10-27 NOTE — Telephone Encounter (Signed)
10/26/2021, Via Delorise Royals, Spanish Interpreter, Patient informed negative Pap/HPV results, repeat in 5 years/ Wet prep-negative, and FIT test-negative. Patient verbalized understanding.

## 2023-11-10 ENCOUNTER — Ambulatory Visit (INDEPENDENT_AMBULATORY_CARE_PROVIDER_SITE_OTHER): Payer: No Typology Code available for payment source | Admitting: Family Medicine

## 2023-11-10 ENCOUNTER — Encounter: Payer: Self-pay | Admitting: Family Medicine

## 2023-11-10 VITALS — BP 130/88 | HR 76 | Temp 98.2°F | Resp 16 | Ht 58.5 in | Wt 163.6 lb

## 2023-11-10 DIAGNOSIS — Z789 Other specified health status: Secondary | ICD-10-CM | POA: Diagnosis not present

## 2023-11-10 DIAGNOSIS — Z7689 Persons encountering health services in other specified circumstances: Secondary | ICD-10-CM

## 2023-11-10 DIAGNOSIS — Z23 Encounter for immunization: Secondary | ICD-10-CM

## 2023-11-10 DIAGNOSIS — G629 Polyneuropathy, unspecified: Secondary | ICD-10-CM

## 2023-11-10 LAB — POCT GLYCOSYLATED HEMOGLOBIN (HGB A1C): Hemoglobin A1C: 5.7 % — AB (ref 4.0–5.6)

## 2023-11-10 MED ORDER — GABAPENTIN 300 MG PO CAPS: 300.0000 mg | ORAL_CAPSULE | Freq: Every day | ORAL | 2 refills | Status: DC

## 2023-11-10 NOTE — Progress Notes (Signed)
 New Patient Office Visit  Subjective    Patient ID: Linda Bridges, female    DOB: 06-24-1971  Age: 53 y.o. MRN: [id number]  CC:  Chief Complaint  Patient presents with   Establish Care    Pain and burning in the soles of her feet    HPI Linda Bridges presents to establish care and for complaint of pain an burning on the soles of her feet for several months that is worsening. Patient denies any known trauma or injury.    Outpatient Encounter Medications as of 11/10/2023  Medication Sig   gabapentin  (NEURONTIN ) 300 MG capsule Take 1 capsule (300 mg total) by mouth at bedtime.   ciprofloxacin  (CIPRO ) 500 MG tablet Take 1 tablet (500 mg total) by mouth 2 (two) times daily. (Patient not taking: Reported on 11/10/2023)   fexofenadine (ALLEGRA) 180 MG tablet Take 180 mg by mouth daily. Reported on 01/15/2016 (Patient not taking: Reported on 11/10/2023)   ibuprofen (ADVIL,MOTRIN) 200 MG tablet Take 800 mg by mouth every 6 (six) hours as needed. Reported on 11/20/2015 (Patient not taking: Reported on 11/10/2023)   verapamil (CALAN) 80 MG tablet Take 80 mg by mouth 2 (two) times daily. (Patient not taking: Reported on 11/10/2023)   No facility-administered encounter medications on file as of 11/10/2023.    Past Medical History:  Diagnosis Date   Allergy    Ear symptoms mainly   Decreased visual acuity    Migraines    Onychomycosis 07-02-2015    Past Surgical History:  Procedure Laterality Date   CESAREAN SECTION  12/31/2004--last of 3   All 3 children delivered by Cesarian section   TUBAL LIGATION Bilateral 2006    Family History  Problem Relation Age of Onset   Heart disease Mother 63       coronary stents x 2   Diabetes Father    Parkinson's disease Father    Breast cancer Neg Hx     Social History   Socioeconomic History   Marital status: Married    Spouse name: Not on file   Number of children: 3   Years of education: 6   Highest education level: 6th  grade  Occupational History   Occupation: Human resources officer  Tobacco Use   Smoking status: Never   Smokeless tobacco: Never  Vaping Use   Vaping status: Never Used  Substance and Sexual Activity   Alcohol use: No    Alcohol/week: 0.0 standard drinks of alcohol   Drug use: No   Sexual activity: Yes    Birth control/protection: Surgical  Other Topics Concern   Not on file  Social History Narrative   Originally from [place].   [place]oved to [place] In __   LI[place] with husband and 3 children   Social Drivers of Corporate Investment Banker Strain: Low Risk  (11/10/2023)   Overall Financial Resource Strain (CARDIA)    Difficulty of Paying Living Expenses: Not hard at all  Food Insecurity: No Food Insecurity (10/07/2021)   Hunger Vital Sign    Worried About Running Out of Food in the Last Year: Never true    Ran Out of Food in the Last Year: Never true  Transportation Needs: No Transportation Needs (10/07/2021)   PRAPARE - Administrator, Civil Service (Medical): No    Lack of Transportation (Non-Medical): No  Physical Activity: Sufficiently Active (11/10/2023)   Exercise Vital Sign    Days of Exercise per Week: 7 days  Minutes of Exercise per Session: 30 min  Stress: No Stress Concern Present (11/10/2023)   [name] of Occupational Health - Occupational Stress Questionnaire    Feeling of Stress : Not at all  Social Connections: Moderately Integrated (11/10/2023)   Social Connection and Isolation Panel [NHANES]    Frequency of Communication with Friends and Family: More than three times a week    Frequency of Social Gatherings with Friends and Family: More than three times a week    Attends Religious Services: More than 4 times per year    Active Member of [employer] or Organizations: No    Attends Banker Meetings: Never    Marital Status: Married  Catering Manager Violence: Not At Risk (11/10/2023)   Humiliation, Afraid, Rape, and Kick questionnaire    Fear of  Current or Ex-Partner: No    Emotionally Abused: No    Physically Abused: No    Sexually Abused: No    Review of Systems  All other systems reviewed and are negative.       Objective   BP 130/88 (BP Location: Right Arm, Patient Position: Sitting, Cuff Size: Normal)   Pulse 76   Temp 98.2 F (36.8 C) (Oral)   Resp 16   Ht 4' 10.5 (1.486 m)   Wt 163 lb 9.6 oz (74.2 kg)   SpO2 95%   BMI 33.61 kg/m   Physical Exam Vitals and nursing note reviewed.  Constitutional:      General: She is not in acute distress. Cardiovascular:     Rate and Rhythm: Normal rate and regular rhythm.  Pulmonary:     Effort: Pulmonary effort is normal.     Breath sounds: Normal breath sounds.  Abdominal:     Palpations: Abdomen is soft.     Tenderness: There is no abdominal tenderness.  Neurological:     General: No focal deficit present.     Mental Status: She is alert and oriented to person, place, and time.         Assessment & Plan:   1. Neuropathy (Primary) Foot exam was unremarkable. Neurontin  prescribed. A1c was wnl  2. Encounter for immunization  - Flu vaccine trivalent PF, 6mos and older(Flulaval,Afluria,Fluarix,Fluzone)  3. Encounter to establish care  - POCT glycosylated hemoglobin (Hb A1C)  4. Language barrier to communication    Return in about 3 months (around 02/08/2024) for follow up.   Tanda Raguel SQUIBB, MD

## 2023-11-11 LAB — CMP14+EGFR
ALT: 15 [IU]/L (ref 0–32)
AST: 13 [IU]/L (ref 0–40)
Albumin: 4.3 g/dL (ref 3.8–4.9)
Alkaline Phosphatase: 119 [IU]/L (ref 44–121)
BUN/Creatinine Ratio: 22 (ref 9–23)
BUN: 13 mg/dL (ref 6–24)
Bilirubin Total: 0.5 mg/dL (ref 0.0–1.2)
CO2: 22 mmol/L (ref 20–29)
Calcium: 9.3 mg/dL (ref 8.7–10.2)
Chloride: 101 mmol/L (ref 96–106)
Creatinine, Ser: 0.6 mg/dL (ref 0.57–1.00)
Globulin, Total: 3.3 g/dL (ref 1.5–4.5)
Glucose: 93 mg/dL (ref 70–99)
Potassium: 4.3 mmol/L (ref 3.5–5.2)
Sodium: 141 mmol/L (ref 134–144)
Total Protein: 7.6 g/dL (ref 6.0–8.5)
eGFR: 107 mL/min/{1.73_m2} (ref >59)

## 2023-11-11 LAB — HEMOGLOBIN A1C
Est. average glucose Bld gHb Est-mCnc: 120 mg/dL
Hgb A1c MFr Bld: 5.8 *Deleted — ABNORMAL HIGH (ref 4.8–5.6)

## 2023-11-14 ENCOUNTER — Encounter: Payer: Self-pay | Admitting: Family Medicine

## 2023-12-11 ENCOUNTER — Encounter: Payer: Self-pay | Admitting: Family Medicine

## 2023-12-11 ENCOUNTER — Ambulatory Visit (INDEPENDENT_AMBULATORY_CARE_PROVIDER_SITE_OTHER): Payer: No Typology Code available for payment source | Admitting: Family Medicine

## 2023-12-11 VITALS — BP 127/83 | HR 110 | Temp 98.4°F | Resp 16 | Ht <= 58 in | Wt 163.6 lb

## 2023-12-11 DIAGNOSIS — Z130 Encounter for screening for diseases of the blood and blood-forming organs and certain disorders involving the immune mechanism: Secondary | ICD-10-CM

## 2023-12-11 DIAGNOSIS — Z0000 Encounter for general adult medical examination without abnormal findings: Secondary | ICD-10-CM | POA: Diagnosis not present

## 2023-12-11 DIAGNOSIS — Z789 Other specified health status: Secondary | ICD-10-CM

## 2023-12-11 DIAGNOSIS — Z13220 Encounter for screening for lipoid disorders: Secondary | ICD-10-CM

## 2023-12-11 DIAGNOSIS — Z1211 Encounter for screening for malignant neoplasm of colon: Secondary | ICD-10-CM | POA: Diagnosis not present

## 2023-12-11 DIAGNOSIS — Z1231 Encounter for screening mammogram for malignant neoplasm of breast: Secondary | ICD-10-CM

## 2023-12-11 NOTE — Progress Notes (Signed)
Established Patient Office Visit  Subjective    Patient ID: Linda Bridges, female    DOB: 28-Dec-1970  Age: 53 y.o. MRN: [id number]  CC:  Chief Complaint  Patient presents with   Follow-up    4 week , feet are still hurting,    Annual Exam    HPI Linda Bridges presents for routine annual exam. Patient denies acute complaints or concerns. This visit was aided by an interpreter.   Outpatient Encounter Medications as of 12/11/2023  Medication Sig   gabapentin (NEURONTIN) 300 MG capsule Take 1 capsule (300 mg total) by mouth at bedtime.   ciprofloxacin (CIPRO) 500 MG tablet Take 1 tablet (500 mg total) by mouth 2 (two) times daily. (Patient not taking: Reported on 11/10/2023)   fexofenadine (ALLEGRA) 180 MG tablet Take 180 mg by mouth daily. Reported on 01/15/2016 (Patient not taking: Reported on 11/10/2023)   ibuprofen (ADVIL,MOTRIN) 200 MG tablet Take 800 mg by mouth every 6 (six) hours as needed. Reported on 11/20/2015 (Patient not taking: Reported on 11/10/2023)   No facility-administered encounter medications on file as of 12/11/2023.    Past Medical History:  Diagnosis Date   Allergy    Ear symptoms mainly   Decreased visual acuity    Migraines    Onychomycosis 07-02-2015    Past Surgical History:  Procedure Laterality Date   CESAREAN SECTION  12/31/2004--last of 3   All 3 children delivered by Cesarian section   TUBAL LIGATION Bilateral 2006    Family History  Problem Relation Age of Onset   Heart disease Mother 37       coronary stents x 2   Diabetes Father    Parkinson's disease Father    Breast cancer Neg Hx     Social History   Socioeconomic History   Marital status: Married    Spouse name: Not on file   Number of children: 3   Years of education: 6   Highest education level: 6th grade  Occupational History   Occupation: Human resources officer  Tobacco Use   Smoking status: Never   Smokeless tobacco: Never  Vaping Use   Vaping status: Never  Used  Substance and Sexual Activity   Alcohol use: No    Alcohol/week: 0.0 standard drinks of alcohol   Drug use: No   Sexual activity: Yes    Birth control/protection: Surgical  Other Topics Concern   Not on file  Social History Narrative   Originally from [place].   [place] In __   [place] with husband and 3 children   Social Drivers of Corporate investment banker Strain: Low Risk  (11/10/2023)   Overall Financial Resource Strain (CARDIA)    Difficulty of Paying Living Expenses: Not hard at all  Food Insecurity: No Food Insecurity (10/07/2021)   Hunger Vital Sign    Worried About Running Out of Food in the Last Year: Never true    Ran Out of Food in the Last Year: Never true  Transportation Needs: No Transportation Needs (10/07/2021)   PRAPARE - Administrator, Civil Service (Medical): No    Lack of Transportation (Non-Medical): No  Physical Activity: Sufficiently Active (11/10/2023)   Exercise Vital Sign    Days of Exercise per Week: 7 days    Minutes of Exercise per Session: 30 min  Stress: No Stress Concern Present (11/10/2023)   [name] of Occupational Health - Occupational Stress Questionnaire    Feeling of Stress :  Not at all  Social Connections: Moderately Integrated (11/10/2023)   Social Connection and Isolation Panel [NHANES]    Frequency of Communication with Friends and Family: More than three times a week    Frequency of Social Gatherings with Friends and Family: More than three times a week    Attends Religious Services: More than 4 times per year    Active Member of [employer] or Organizations: No    Attends Banker Meetings: Never    Marital Status: Married  Catering manager Violence: Not At Risk (11/10/2023)   Humiliation, Afraid, Rape, and Kick questionnaire    Fear of Current or Ex-Partner: No    Emotionally Abused: No    Physically Abused: No    Sexually Abused: No    Review of Systems  All other systems reviewed and are  negative.       Objective    BP 127/83 (BP Location: Right Arm, Patient Position: Sitting, Cuff Size: Normal)   Pulse (!) 110   Temp 98.4 F (36.9 C) (Oral)   Resp 16   Ht 4' 9.5" (1.461 m)   Wt 163 lb 9.6 oz (74.2 kg)   SpO2 96%   BMI 34.79 kg/m   Physical Exam Vitals and nursing note reviewed.  Constitutional:      General: She is not in acute distress. HENT:     Head: Normocephalic and atraumatic.     Right Ear: Tympanic membrane, ear canal and external ear normal.     Left Ear: Tympanic membrane, ear canal and external ear normal.     Nose: Nose normal.     Mouth/Throat:     Mouth: Mucous membranes are moist.     Pharynx: Oropharynx is clear.  Eyes:     Conjunctiva/sclera: Conjunctivae normal.     Pupils: Pupils are equal, round, and reactive to light.  Neck:     Thyroid: No thyromegaly.  Cardiovascular:     Rate and Rhythm: Normal rate and regular rhythm.     Heart sounds: Normal heart sounds. No murmur heard. Pulmonary:     Effort: Pulmonary effort is normal. No respiratory distress.     Breath sounds: Normal breath sounds.  Abdominal:     General: There is no distension.     Palpations: Abdomen is soft. There is no mass.     Tenderness: There is no abdominal tenderness.  Musculoskeletal:        General: Normal range of motion.     Cervical back: Normal range of motion and neck supple.  Skin:    General: Skin is warm and dry.  Neurological:     General: No focal deficit present.     Mental Status: She is alert and oriented to person, place, and time.  Psychiatric:        Mood and Affect: Mood normal.        Behavior: Behavior normal.         Assessment & Plan:   Annual physical exam -     CMP14+EGFR  Screening for deficiency anemia -     CBC with Differential/Platelet  Screening for lipid disorders -     Lipid panel  Screening for colon cancer -     Cologuard  Encounter for screening mammogram for malignant neoplasm of breast -      3D Screening Mammogram, Left and Right; Future  Language barrier to communication     Return in about 1 year (around 12/10/2024) for physical.  Tommie Raymond, MD

## 2023-12-12 LAB — CMP14+EGFR
ALT: 20 [IU]/L (ref 0–32)
AST: 18 [IU]/L (ref 0–40)
Albumin: 4.3 g/dL (ref 3.8–4.9)
Alkaline Phosphatase: 119 [IU]/L (ref 44–121)
BUN/Creatinine Ratio: 20 (ref 9–23)
BUN: 15 mg/dL (ref 6–24)
Bilirubin Total: 0.4 mg/dL (ref 0.0–1.2)
CO2: 22 mmol/L (ref 20–29)
Calcium: 9.6 mg/dL (ref 8.7–10.2)
Chloride: 104 mmol/L (ref 96–106)
Creatinine, Ser: 0.76 mg/dL (ref 0.57–1.00)
Globulin, Total: 3.1 g/dL (ref 1.5–4.5)
Glucose: 84 mg/dL (ref 70–99)
Potassium: 4.3 mmol/L (ref 3.5–5.2)
Sodium: 142 mmol/L (ref 134–144)
Total Protein: 7.4 g/dL (ref 6.0–8.5)
eGFR: 94 mL/min/{1.73_m2} (ref >59)

## 2023-12-12 LAB — CBC WITH DIFFERENTIAL/PLATELET
Basophils Absolute: 0.1 10*3/uL (ref 0.0–0.2)
Basos: 1 %
EOS (ABSOLUTE): 0.1 10*3/uL (ref 0.0–0.4)
Eos: 1 %
Hematocrit: 43.8 % (ref 34.0–46.6)
Hemoglobin: 14.5 g/dL (ref 11.1–15.9)
Immature Grans (Abs): 0 10*3/uL (ref 0.0–0.1)
Immature Granulocytes: 0 %
Lymphocytes Absolute: 2.1 10*3/uL (ref 0.7–3.1)
Lymphs: 25 %
MCH: 30.3 pg (ref 26.6–33.0)
MCHC: 33.1 g/dL (ref 31.5–35.7)
MCV: 91 fL (ref 79–97)
Monocytes Absolute: 0.5 10*3/uL (ref 0.1–0.9)
Monocytes: 6 %
Neutrophils Absolute: 5.7 10*3/uL (ref 1.4–7.0)
Neutrophils: 67 %
Platelets: 335 10*3/uL (ref 150–450)
RBC: 4.79 x10E6/uL (ref 3.77–5.28)
RDW: 12.8 % (ref 11.7–15.4)
WBC: 8.3 10*3/uL (ref 3.4–10.8)

## 2023-12-12 LAB — LIPID PANEL
Chol/HDL Ratio: 4.5 {ratio} — ABNORMAL HIGH (ref 0.0–4.4)
Cholesterol, Total: 215 mg/dL — ABNORMAL HIGH (ref 100–199)
HDL: 48 mg/dL (ref >39)
LDL Chol Calc (NIH): 136 mg/dL — ABNORMAL HIGH (ref 0–99)
Triglycerides: 175 mg/dL — ABNORMAL HIGH (ref 0–149)
VLDL Cholesterol Cal: 31 mg/dL (ref 5–40)

## 2023-12-13 ENCOUNTER — Encounter: Payer: Self-pay | Admitting: Family Medicine

## 2023-12-14 ENCOUNTER — Ambulatory Visit
Admission: RE | Admit: 2023-12-14 | Discharge: 2023-12-14 | Disposition: A | Discharge status: Home / Self Care | Payer: No Typology Code available for payment source | Source: Ambulatory Visit | Attending: Family Medicine | Admitting: Family Medicine

## 2023-12-14 DIAGNOSIS — Z1231 Encounter for screening mammogram for malignant neoplasm of breast: Secondary | ICD-10-CM

## 2023-12-22 LAB — COLOGUARD: COLOGUARD: NEGATIVE

## 2024-01-03 ENCOUNTER — Ambulatory Visit: Payer: No Typology Code available for payment source | Admitting: Family

## 2024-01-12 ENCOUNTER — Ambulatory Visit (INDEPENDENT_AMBULATORY_CARE_PROVIDER_SITE_OTHER): Admitting: Family

## 2024-01-12 VITALS — BP 110/77 | HR 99 | Temp 98.7°F | Ht <= 58 in | Wt 164.8 lb

## 2024-01-12 DIAGNOSIS — G629 Polyneuropathy, unspecified: Secondary | ICD-10-CM

## 2024-01-12 DIAGNOSIS — Z603 Acculturation difficulty: Secondary | ICD-10-CM | POA: Diagnosis not present

## 2024-01-12 DIAGNOSIS — Z758 Other problems related to medical facilities and other health care: Secondary | ICD-10-CM | POA: Diagnosis not present

## 2024-01-12 MED ORDER — GABAPENTIN 300 MG PO CAPS
300.0000 mg | ORAL_CAPSULE | Freq: Two times a day (BID) | ORAL | 2 refills | Status: AC
Start: 2024-01-12 — End: ?

## 2024-01-12 NOTE — Progress Notes (Signed)
 Patient ID: Linda Bridges, female    DOB: 10-13-1971  MRN: [id number]  CC: Neuropathy Follow-Up  Subjective: Linda Bridges is a 53 y.o. female who presents for neuropathy follow-up.   Her concerns today include:  Neuropathy of bilateral feet persisting. Denies red flag symptoms. States doesn't feel Gabapentin helped much.    Patient Active Problem List   Diagnosis Date Noted   Allergy    Migraines      Current Outpatient Medications on File Prior to Visit  Medication Sig Dispense Refill   ciprofloxacin (CIPRO) 500 MG tablet Take 1 tablet (500 mg total) by mouth 2 (two) times daily. (Patient not taking: Reported on 01/12/2024) 6 tablet 0   fexofenadine (ALLEGRA) 180 MG tablet Take 180 mg by mouth daily. Reported on 01/15/2016 (Patient not taking: Reported on 01/12/2024)     ibuprofen (ADVIL,MOTRIN) 200 MG tablet Take 800 mg by mouth every 6 (six) hours as needed. Reported on 11/20/2015 (Patient not taking: Reported on 01/12/2024)     No current facility-administered medications on file prior to visit.    No Known Allergies  Social History   Socioeconomic History   Marital status: Married    Spouse name: Not on file   Number of children: 3   Years of education: 6   Highest education level: 6th grade  Occupational History   Occupation: Human resources officer  Tobacco Use   Smoking status: Never   Smokeless tobacco: Never  Vaping Use   Vaping status: Never Used  Substance and Sexual Activity   Alcohol use: No    Alcohol/week: 0.0 standard drinks of alcohol   Drug use: No   Sexual activity: Yes    Birth control/protection: Surgical  Other Topics Concern   Not on file  Social History Narrative   Originally from [place].   [place] In __   [place] with husband and 3 children   Social Drivers of Corporate investment banker Strain: Low Risk  (11/10/2023)   Overall Financial Resource Strain (CARDIA)    Difficulty of Paying Living Expenses: Not hard at  all  Food Insecurity: No Food Insecurity (10/07/2021)   Hunger Vital Sign    Worried About Running Out of Food in the Last Year: Never true    Ran Out of Food in the Last Year: Never true  Transportation Needs: No Transportation Needs (10/07/2021)   PRAPARE - Administrator, Civil Service (Medical): No    Lack of Transportation (Non-Medical): No  Physical Activity: Sufficiently Active (11/10/2023)   Exercise Vital Sign    Days of Exercise per Week: 7 days    Minutes of Exercise per Session: 30 min  Stress: No Stress Concern Present (11/10/2023)   [name] of Occupational Health - Occupational Stress Questionnaire    Feeling of Stress : Not at all  Social Connections: Moderately Integrated (11/10/2023)   Social Connection and Isolation Panel [NHANES]    Frequency of Communication with Friends and Family: More than three times a week    Frequency of Social Gatherings with Friends and Family: More than three times a week    Attends Religious Services: More than 4 times per year    Active Member of [employer] or Organizations: No    Attends Banker Meetings: Never    Marital Status: Married  Catering manager Violence: Not At Risk (11/10/2023)   Humiliation, Afraid, Rape, and Kick questionnaire    Fear of Current or Ex-Partner: No  Emotionally Abused: No    Physically Abused: No    Sexually Abused: No    Family History  Problem Relation Age of Onset   Heart disease Mother 3       coronary stents x 2   Diabetes Father    Parkinson's disease Father    Breast cancer Neg Hx     Past Surgical History:  Procedure Laterality Date   CESAREAN SECTION  12/31/2004--last of 3   All 3 children delivered by Cesarian section   TUBAL LIGATION Bilateral 2006    ROS: Review of Systems Negative except as stated above  PHYSICAL EXAM: BP 110/77   Pulse 99   Temp 98.7 F (37.1 C) (Oral)   Ht 4' 9.5" (1.461 m)   Wt 164 lb 12.8 oz (74.8 kg)   SpO2 94%   BMI 35.04  kg/m   Physical Exam HENT:     Head: Normocephalic and atraumatic.     Nose: Nose normal.     Mouth/Throat:     Mouth: Mucous membranes are moist.     Pharynx: Oropharynx is clear.  Eyes:     Extraocular Movements: Extraocular movements intact.     Conjunctiva/sclera: Conjunctivae normal.     Pupils: Pupils are equal, round, and reactive to light.  Cardiovascular:     Rate and Rhythm: Normal rate and regular rhythm.     Pulses: Normal pulses.     Heart sounds: Normal heart sounds.  Pulmonary:     Effort: Pulmonary effort is normal.     Breath sounds: Normal breath sounds.  Musculoskeletal:        General: Normal range of motion.     Cervical back: Normal range of motion and neck supple.     Right hip: Normal.     Left hip: Normal.     Right upper leg: Normal.     Left upper leg: Normal.     Right knee: Normal.     Left knee: Normal.     Right lower leg: Normal.     Left lower leg: Normal.     Right ankle: Normal.     Left ankle: Normal.     Right foot: Normal.     Left foot: Normal.  Neurological:     General: No focal deficit present.     Mental Status: She is alert and oriented to person, place, and time.  Psychiatric:        Mood and Affect: Mood normal.        Behavior: Behavior normal.    ASSESSMENT AND PLAN: 1. Neuropathy (Primary) - Increase Gabapentin from 300 mg daily to 300 mg twice daily. Counseled on medication adherence/adverse effects. - Follow-up with primary provider in 4 weeks or sooner if needed.  - gabapentin (NEURONTIN) 300 MG capsule; Take 1 capsule (300 mg total) by mouth 2 (two) times daily.  Dispense: 60 capsule; Refill: 2  2. Language barrier - [employer]. ID#: [id number]     Patient was given the opportunity to ask questions.  Patient verbalized understanding of the plan and was able to repeat key elements of the plan. Patient was given clear instructions to go to Emergency Department or return to medical center if symptoms don't  improve, worsen, or new problems develop.The patient verbalized understanding.   Requested Prescriptions   Signed Prescriptions Disp Refills   gabapentin (NEURONTIN) 300 MG capsule 60 capsule 2    Sig: Take 1 capsule (300 mg total) by mouth 2 (  two) times daily.    Return for Follow-Up or next available with Georganna Skeans, MD .  Rema Fendt, NP

## 2024-01-12 NOTE — Progress Notes (Signed)
 Patient states from knee down her legs get very hot. States bottom of foot it feels like it is burning.

## 2024-02-21 ENCOUNTER — Encounter: Payer: Self-pay | Admitting: Family Medicine

## 2024-02-21 ENCOUNTER — Ambulatory Visit (INDEPENDENT_AMBULATORY_CARE_PROVIDER_SITE_OTHER): Admitting: Family Medicine

## 2024-02-21 VITALS — BP 117/81 | HR 100 | Wt 166.8 lb

## 2024-02-21 DIAGNOSIS — G629 Polyneuropathy, unspecified: Secondary | ICD-10-CM | POA: Diagnosis not present

## 2024-02-21 DIAGNOSIS — M79672 Pain in left foot: Secondary | ICD-10-CM

## 2024-02-21 DIAGNOSIS — M79671 Pain in right foot: Secondary | ICD-10-CM

## 2024-02-21 DIAGNOSIS — Z603 Acculturation difficulty: Secondary | ICD-10-CM | POA: Diagnosis not present

## 2024-02-21 DIAGNOSIS — Z758 Other problems related to medical facilities and other health care: Secondary | ICD-10-CM

## 2024-02-21 NOTE — Progress Notes (Unsigned)
 Established Patient Office Visit  Subjective    Patient ID: Linda Bridges, female    DOB: 1971/04/29  Age: 53 y.o. MRN: [id number]  CC:  Chief Complaint  Patient presents with   Medical Management of Chronic Issues    Pain in soles of feet, burning sensation     HPI Linda Bridges presents for follow up of neuropathy. Patient reports that sx have been minimally improved if any with present management.  This visit was aided by an interpreter.                                                                                                                                                                                                                                                                                                                            Outpatient Encounter Medications as of 02/21/2024  Medication Sig   gabapentin  (NEURONTIN ) 300 MG capsule Take 1 capsule (300 mg total) by mouth 2 (two) times daily.   ciprofloxacin  (CIPRO ) 500 MG tablet Take 1 tablet (500 mg total) by mouth 2 (two) times daily. (Patient not taking: Reported on 01/12/2024)   fexofenadine (ALLEGRA) 180 MG tablet Take 180 mg by mouth daily. Reported on 01/15/2016 (Patient not taking: Reported on 01/12/2024)   ibuprofen (ADVIL,MOTRIN) 200 MG tablet Take 800 mg by mouth every 6 (six) hours as needed. Reported on 11/20/2015 (Patient not taking: Reported on 01/12/2024)   No facility-administered encounter medications on file as of 02/21/2024.    Past Medical History:  Diagnosis Date   Allergy    Ear symptoms mainly   Decreased visual acuity    Migraines    Onychomycosis 07-02-2015    Past Surgical History:  Procedure Laterality Date   CESAREAN SECTION  12/31/2004--last of 3   All 3 children delivered by Cesarian section   TUBAL LIGATION Bilateral 2006    Family History  Problem Relation Age of Onset   Heart disease Mother 68  coronary stents x 2   Diabetes Father    Parkinson's  disease Father    Breast cancer Neg Hx     Social History   Socioeconomic History   Marital status: Married    Spouse name: Not on file   Number of children: 3   Years of education: 6   Highest education level: 6th grade  Occupational History   Occupation: Human resources officer  Tobacco Use   Smoking status: Never   Smokeless tobacco: Never  Vaping Use   Vaping status: Never Used  Substance and Sexual Activity   Alcohol use: No    Alcohol/week: 0.0 standard drinks of alcohol   Drug use: No   Sexual activity: Yes    Birth control/protection: Surgical  Other Topics Concern   Not on file  Social History Narrative   Originally from [place].   [place] In __   LIves with husband and 3 children   Social Drivers of Corporate investment banker Strain: Low Risk  (11/10/2023)   Overall Financial Resource Strain (CARDIA)    Difficulty of Paying Living Expenses: Not hard at all  Food Insecurity: No Food Insecurity (10/07/2021)   Hunger Vital Sign    Worried About Running Out of Food in the Last Year: Never true    Ran Out of Food in the Last Year: Never true  Transportation Needs: No Transportation Needs (10/07/2021)   PRAPARE - Administrator, Civil Service (Medical): No    Lack of Transportation (Non-Medical): No  Physical Activity: Sufficiently Active (11/10/2023)   Exercise Vital Sign    Days of Exercise per Week: 7 days    Minutes of Exercise per Session: 30 min  Stress: No Stress Concern Present (11/10/2023)   [name] of Occupational Health - Occupational Stress Questionnaire    Feeling of Stress : Not at all  Social Connections: Moderately Integrated (11/10/2023)   Social Connection and Isolation Panel [NHANES]    Frequency of Communication with Friends and Family: More than three times a week    Frequency of Social Gatherings with Friends and Family: More than three times a week    Attends Religious Services: More than 4 times per year    Active Member  of [employer] or Organizations: No    Attends Banker Meetings: Never    Marital Status: Married  Catering manager Violence: Not At Risk (11/10/2023)   Humiliation, Afraid, Rape, and Kick questionnaire    Fear of Current or Ex-Partner: No    Emotionally Abused: No    Physically Abused: No    Sexually Abused: No    Review of Systems  All other systems reviewed and are negative.       Objective    BP 117/81 (BP Location: Right Arm, Patient Position: Sitting, Cuff Size: Normal)   Pulse 100   Wt 166 lb 12.8 oz (75.7 kg)   SpO2 90%   BMI 35.47 kg/m   Physical Exam Vitals and nursing note reviewed.  Constitutional:      General: She is not in acute distress. Cardiovascular:     Rate and Rhythm: Normal rate and regular rhythm.  Pulmonary:     Effort: Pulmonary effort is normal.     Breath sounds: Normal breath sounds.  Abdominal:     Palpations: Abdomen is soft.     Tenderness: There is no abdominal tenderness.  Neurological:     General: No focal deficit present.  Mental Status: She is alert and oriented to person, place, and time.         Assessment & Plan:   Neuropathy -     Ambulatory referral to Podiatry  Bilateral foot pain -     Ambulatory referral to Podiatry  Language barrier     Return if symptoms worsen or fail to improve.   Arlo Lama, MD

## 2024-02-23 ENCOUNTER — Encounter: Payer: Self-pay | Admitting: Family Medicine

## 2024-02-28 ENCOUNTER — Ambulatory Visit (INDEPENDENT_AMBULATORY_CARE_PROVIDER_SITE_OTHER): Admitting: Podiatrist

## 2024-02-28 DIAGNOSIS — M722 Plantar fascial fibromatosis: Secondary | ICD-10-CM | POA: Diagnosis not present

## 2024-02-28 DIAGNOSIS — G5761 Lesion of plantar nerve, right lower limb: Secondary | ICD-10-CM | POA: Diagnosis not present

## 2024-02-28 DIAGNOSIS — G5790 Unspecified mononeuropathy of unspecified lower limb: Secondary | ICD-10-CM

## 2024-02-28 NOTE — Progress Notes (Signed)
 Chief Complaint  Patient presents with   Foot Pain    Pt. Stated numbness, throbbing and tingling and weakness in the legs for 1 yr. Becoming worst.Pt. taking a unknown medication and hasn't taken in a week. Non diabetic.      HPI: Patient is 52 y.o. female who presents today for pain in both feet. We are communicating via an interpreter for this visit.  She relates she has pain in her heels and forefoot that feel like " balls" when she puts weight on the feet.  She also relates when she is off her feet she has tingling and numbness in her feet as well as numbness on the outer side of both hips equally.  She relates she has had this discomfort for about a year and it is getting worse.  Denies any recent back injury or trauma.  She also states she was on gabapentin  for 3 months and she didn't notice any improvement so she stopped taking it.  She is not diabetic.   Patient Active Problem List   Diagnosis Date Noted   Allergy    Migraines     Current Outpatient Medications on File Prior to Visit  Medication Sig Dispense Refill   ciprofloxacin  (CIPRO ) 500 MG tablet Take 1 tablet (500 mg total) by mouth 2 (two) times daily. 6 tablet 0   fexofenadine (ALLEGRA) 180 MG tablet Take 180 mg by mouth daily. Reported on 01/15/2016 (Patient not taking: Reported on 11/10/2023)     gabapentin  (NEURONTIN ) 300 MG capsule Take 1 capsule (300 mg total) by mouth 2 (two) times daily. 60 capsule 2   ibuprofen (ADVIL,MOTRIN) 200 MG tablet Take 800 mg by mouth every 6 (six) hours as needed. Reported on 11/20/2015 (Patient not taking: Reported on 11/10/2023)     No current facility-administered medications on file prior to visit.    No Known Allergies  Review of Systems No fevers, chills, nausea, muscle aches, no difficulty breathing, no calf pain, no chest pain or shortness of breath.   Physical Exam  GENERAL APPEARANCE: Alert, conversant. Appropriately groomed. No acute distress.   VASCULAR: Pedal pulses  palpable 2/4 DP and 2/4 PT bilateral.  Capillary refill time is immediate to all digits,  Proximal to distal cooling is warm to warm.  Digital perfusion adequate.   NEUROLOGIC: sensation is intact to 5.07 monofilament at 5/5 sites bilateral.  Light touch is intact bilateral, vibratory sensation intact bilateral.  No pain with tapping along the tarsal canal noted.   MUSCULOSKELETAL: acceptable muscle strength, tone and stability bilateral.  Pain with palpation bilateral heels at the plantar medial insertion of the plantar fascia bilateral. Discomfort and the plantar forefoot also noted.    DERMATOLOGIC: skin is warm, supple, and dry.  Color, texture, and turgor of skin within normal limits.  No open wounds are noted.  No preulcerative lesions are seen.  Digital nails are asymptomatic.     Assessment    ICD-10-CM   1. Plantar fasciitis, left  M72.2     2. Plantar fasciitis, right  M72.2     3. Neuropathy of foot, unspecified laterality  G57.90        Plan Via the interpreter we discussed areas of pain and discussed her nerve type pain is possibly coming from her back down to her feet vs from her feet up to her back.  Discussed she likely has 2 issues today.  One is possibly a back issue for which I will refer her to neurosurgery  to have checked out. Another is plantar fasciitis for which we can do stretches and injections vs oral steroid/ NSAIDS if she likes.  She would like to try injections today in both heels.  This was carried out see procedure note below.  Stretching exercises dispensed and shoe gear discussed.  She will return for recheck in 3-4 weeks for foot pain.

## 2024-02-28 NOTE — Patient Instructions (Signed)
 Ejercicios para la fascitis plantar Exercises for Plantar Fasciitis Los ejercicios para el pie y la pierna pueden ayudar si tiene fascitis plantar. Haga solo los ejercicios que le hayan indicado. Asegrese de comprender cmo hacer los ejercicios de [place] segura. Siga los pasos que se describen a continuacin. Es normal sentir molestias leves. Detngase si siente dolor o el dolor empeora. No comience estos ejercicios hasta que el mdico se lo indique. Ejercicios de elongacin y amplitud de movimiento Estos ejercicios precalientan los msculos y las articulaciones. Tambin ayudan con el movimiento y la flexibilidad del pie. Pueden ayudar a Engineer, materials. Estiramiento de la fascia plantar Este ejercicio estirar la fascia plantar, que es una banda de tejido grueso que se encuentra en la parte inferior del pie. Sintese con la pierna izquierda/derecha cruzada sobre la otra rodilla. Sostenga el taln con una mano, con el pulgar cerca del arco. Con la otra mano, agrrese los dedos del pie. Empuje suavemente los dedos de los pies hacia la parte superior del pie. Debe sentir un estiramiento en la parte inferior de los dedos del pie, en la parte inferior del pie, o en ambos lugares. Mantenga este estiramiento durante __30________ segundos. Suelte lentamente los dedos. Vuelva a la posicin inicial. Repita ___10_______ veces. Haga este ejercicio _____3_______ Georgianna Kirschner da. Estiramiento de los [employer], de pie Este ejercicio tambin se denomina estiramiento de la parte superior de la pantorrilla, o de los msculos gemelos. Estira los msculos de la parte posterior de la parte superior de la pantorrilla. Prese con las [employer] pared. Extienda la pierna derecha/izquierda por detrs suyo. Flexione levemente la rodilla delantera. Mantenga los talones apoyados en el suelo, los dedos del pie apuntando hacia delante y la rodilla de atrs extendida. Lleve el peso hacia la pared. No arquee  la espalda. Debe sentir un ligero estiramiento en la parte superior de la pantorrilla. Mantenga esta posicin durante __________ segundos. Repita __________ veces. Haga este ejercicio ____________ Georgianna Kirschner da. Estiramiento del msculo sleo, de pie Este ejercicio se denomina estiramiento de la parte inferior de la pantorrilla, o sleo. Estira los msculos de la parte posterior de la parte inferior de la pantorrilla. Prese con las [employer] pared. Extienda la pierna izquierda/derecha hacia atrs y flexione ligeramente la rodilla de la pierna de adelante. Mantenga los talones apoyados en el suelo y los dedos del pie apuntando hacia delante. Flexione la rodilla de atrs y lleve el peso ligeramente a la pierna de atrs. Debe sentir un estiramiento suave en la parte profunda de la parte inferior de la pantorrilla. Mantenga esta posicin durante __________ segundos. Repita __________ veces. Haga este ejercicio ____________ Georgianna Kirschner da. Estiramiento de los [employer] gemelos y sleo, de pie con un escaln Este ejercicio estira los msculos de la parte posterior de la parte inferior de la pierna. Esto incluye los msculos gemelos y sleo. Prese delante de un escaln apoyando solo la regin metatarsiana de su pie derecho/izquierdo. La regin metatarsiana del pie es la superficie sobre la que caminamos, justo debajo de los dedos. Mantenga el otro pie apoyado con firmeza en el mismo escaln. Sostngase de la pared o de una baranda para mantener el equilibrio. Levante lentamente el otro pie y permita que el peso del cuerpo presione el taln sobre el borde del frente del escaln. Mantenga la rodilla recta y sin doblar. Debe sentir un estiramiento en la pantorrilla. Mantenga esta posicin durante __________ segundos. Vuelva a poner ambos pies sobre el escaln.  Repita este ejercicio con una leve flexin en la rodilla izquierda/derecha. Reptalo __________ veces con la rodilla izquierda/derecha  extendida y __________ veces con la rodilla izquierda/derecha flexionada. Haga este ejercicio ____________ Georgianna Kirschner da. Ejercicio de equilibrio Este ejercicio aumenta el equilibrio y el control de la fuerza del arco. Ayuda a eliminar la presin de la fascia plantar. Pararse sobre una pierna Si este ejercicio es muy fcil, puede intentar hacerlo con los ojos cerrados o parado sobre [place]. Sin calzado, prese cerca de una baranda o una puerta. Puede sostenerse de la baranda o del marco de la puerta, segn lo necesite. Prese sobre el pie izquierdo/derecho. Mantenga el dedo gordo del pie en el suelo. Levante el arco del pie. Debe sentir un estiramiento en la parte de abajo del pie y el arco. No deje que el pie se vaya hacia adentro. Mantenga esta posicin durante __________ segundos. Repita __________ veces. Haga este ejercicio ____________ Georgianna Kirschner da. Esta informacin no tiene Theme park manager el consejo del mdico. Asegrese de hacerle al mdico cualquier pregunta que tenga. Document Revised: 05/01/2023 Document Reviewed: 05/01/2023 Elsevier Patient Education  2024 [employer].

## 2024-02-29 ENCOUNTER — Encounter: Payer: Self-pay | Admitting: Podiatrist

## 2024-03-27 ENCOUNTER — Ambulatory Visit (INDEPENDENT_AMBULATORY_CARE_PROVIDER_SITE_OTHER): Admitting: Podiatry

## 2024-03-27 ENCOUNTER — Encounter: Payer: Self-pay | Admitting: Podiatry

## 2024-03-27 DIAGNOSIS — M722 Plantar fascial fibromatosis: Secondary | ICD-10-CM | POA: Diagnosis not present

## 2024-03-27 DIAGNOSIS — G5790 Unspecified mononeuropathy of unspecified lower limb: Secondary | ICD-10-CM | POA: Diagnosis not present

## 2024-03-27 MED ORDER — GABAPENTIN 300 MG PO CAPS
300.0000 mg | ORAL_CAPSULE | Freq: Three times a day (TID) | ORAL | 3 refills | Status: AC
Start: 2024-03-27 — End: ?

## 2024-03-27 NOTE — Progress Notes (Signed)
 Subjective:   Patient ID: Linda Bridges, female   DOB: 53 y.o.   MRN: [id number]   HPI Patient presents with interpreter with pain in the heels of both feet and a lot of shooting burning pain in the lower legs bilateral   ROS      Objective:  Physical Exam  Neurovascular status intact with significant improvement plantar fascia bilateral fluid buildup still noted but much better with moderate shooting pains lower legs and into the digits bilateral with no diminishment of neurological sensation     Assessment:  Probable peripheral low-grade neuropathy bilateral along with well improved plantar fascial inflammation bilateral     Plan:  Discussed with caregiver condition spent a great deal of time going through translator this condition and recommended exercises support shoes for the heels and we will start on gabapentin  300 mg at night followed by 1 morning 1 midday as needed for neuropathic like symptomatology and if not improved will require a neurological consult

## 2024-07-04 ENCOUNTER — Ambulatory Visit: Payer: Self-pay

## 2024-12-10 ENCOUNTER — Encounter: Admitting: Family Medicine
